# Patient Record
Sex: Female | Born: 1960
Health system: Southern US, Community
[De-identification: ages and names within clinical notes are randomized; demographics above are authoritative.]

## PROBLEM LIST (undated history)

## (undated) DIAGNOSIS — R0789 Other chest pain: Secondary | ICD-10-CM

## (undated) DIAGNOSIS — E785 Hyperlipidemia, unspecified: Secondary | ICD-10-CM

## (undated) DIAGNOSIS — E7800 Pure hypercholesterolemia, unspecified: Secondary | ICD-10-CM

## (undated) DIAGNOSIS — F419 Anxiety disorder, unspecified: Secondary | ICD-10-CM

## (undated) HISTORY — DX: Hyperlipidemia, unspecified: E78.5

## (undated) HISTORY — PX: TUBAL LIGATION: SHX77

## (undated) HISTORY — PX: APPENDECTOMY: SHX54

## (undated) HISTORY — DX: Anxiety disorder, unspecified: F41.9

## (undated) HISTORY — PX: SHOULDER ARTHROSCOPY: SHX128

## (undated) HISTORY — DX: Pure hypercholesterolemia, unspecified: E78.00

## (undated) HISTORY — DX: Other chest pain: R07.89

---

## 2005-05-25 ENCOUNTER — Other Ambulatory Visit: Admission: RE | Admit: 2005-05-25 | Discharge: 2005-05-25 | Payer: Self-pay | Admitting: Family Medicine

## 2006-05-28 ENCOUNTER — Other Ambulatory Visit: Admission: RE | Admit: 2006-05-28 | Discharge: 2006-05-28 | Payer: Self-pay | Admitting: Family Medicine

## 2007-06-02 ENCOUNTER — Other Ambulatory Visit: Admission: RE | Admit: 2007-06-02 | Discharge: 2007-06-02 | Payer: Self-pay | Admitting: Family Medicine

## 2007-08-14 ENCOUNTER — Encounter: Admission: RE | Admit: 2007-08-14 | Discharge: 2007-08-14 | Payer: Self-pay | Admitting: Family Medicine

## 2007-08-15 ENCOUNTER — Other Ambulatory Visit: Admission: RE | Admit: 2007-08-15 | Discharge: 2007-08-15 | Payer: Self-pay | Admitting: Family Medicine

## 2007-08-22 ENCOUNTER — Encounter: Admission: RE | Admit: 2007-08-22 | Discharge: 2007-08-22 | Payer: Self-pay | Admitting: Family Medicine

## 2008-02-16 ENCOUNTER — Encounter: Admission: RE | Admit: 2008-02-16 | Discharge: 2008-02-16 | Payer: Self-pay | Admitting: Family Medicine

## 2008-08-17 ENCOUNTER — Other Ambulatory Visit: Admission: RE | Admit: 2008-08-17 | Discharge: 2008-08-17 | Payer: Self-pay | Admitting: Family Medicine

## 2008-08-20 ENCOUNTER — Encounter: Admission: RE | Admit: 2008-08-20 | Discharge: 2008-08-20 | Payer: Self-pay | Admitting: Family Medicine

## 2009-02-24 ENCOUNTER — Encounter: Admission: RE | Admit: 2009-02-24 | Discharge: 2009-02-24 | Payer: Self-pay | Admitting: Family Medicine

## 2009-08-30 ENCOUNTER — Other Ambulatory Visit: Admission: RE | Admit: 2009-08-30 | Discharge: 2009-08-30 | Payer: Self-pay | Admitting: Family Medicine

## 2009-09-05 ENCOUNTER — Encounter: Admission: RE | Admit: 2009-09-05 | Discharge: 2009-09-05 | Payer: Self-pay | Admitting: Family Medicine

## 2010-08-27 ENCOUNTER — Encounter: Payer: Self-pay | Admitting: Family Medicine

## 2010-09-13 ENCOUNTER — Other Ambulatory Visit: Payer: Self-pay | Admitting: Family Medicine

## 2010-09-13 ENCOUNTER — Other Ambulatory Visit (HOSPITAL_COMMUNITY)
Admission: RE | Admit: 2010-09-13 | Discharge: 2010-09-13 | Disposition: A | Discharge status: Home / Self Care | Payer: Managed Care, Other (non HMO) | Source: Ambulatory Visit | Attending: Family Medicine | Admitting: Family Medicine

## 2010-09-13 DIAGNOSIS — Z124 Encounter for screening for malignant neoplasm of cervix: Secondary | ICD-10-CM | POA: Insufficient documentation

## 2011-07-04 ENCOUNTER — Other Ambulatory Visit: Payer: Self-pay | Admitting: Family Medicine

## 2011-07-04 DIAGNOSIS — Z1231 Encounter for screening mammogram for malignant neoplasm of breast: Secondary | ICD-10-CM

## 2011-07-23 ENCOUNTER — Emergency Department (HOSPITAL_COMMUNITY)
Admission: RE | Admit: 2011-07-23 | Discharge: 2011-07-23 | Disposition: A | Discharge status: Home / Self Care | Payer: Managed Care, Other (non HMO) | Source: Ambulatory Visit | Attending: Emergency Medicine | Admitting: Emergency Medicine

## 2011-07-23 ENCOUNTER — Emergency Department (INDEPENDENT_AMBULATORY_CARE_PROVIDER_SITE_OTHER)
Admission: EM | Admit: 2011-07-23 | Discharge: 2011-07-23 | Disposition: A | Discharge status: Home / Self Care | Payer: Managed Care, Other (non HMO) | Source: Home / Self Care | Attending: Emergency Medicine | Admitting: Emergency Medicine

## 2011-07-23 DIAGNOSIS — IMO0002 Reserved for concepts with insufficient information to code with codable children: Secondary | ICD-10-CM

## 2011-07-23 DIAGNOSIS — S93409A Sprain of unspecified ligament of unspecified ankle, initial encounter: Secondary | ICD-10-CM

## 2011-07-23 DIAGNOSIS — S93699A Other sprain of unspecified foot, initial encounter: Secondary | ICD-10-CM

## 2011-07-23 DIAGNOSIS — Z0389 Encounter for observation for other suspected diseases and conditions ruled out: Secondary | ICD-10-CM | POA: Insufficient documentation

## 2011-07-23 MED ORDER — HYDROCODONE-ACETAMINOPHEN 5-325 MG PO TABS: 2.0000 | ORAL_TABLET | ORAL | Status: AC | PRN

## 2011-07-23 NOTE — ED Notes (Signed)
Pt placed in wheel chair with wheels lock before moving them onto chair. Pt placed in vehicle to be taken to get x-ray done.

## 2011-07-23 NOTE — ED Provider Notes (Signed)
History     CSN: [id number] Arrival date & time: 07/23/2011  8:25 AM   First MD Initiated Contact with Patient 07/23/11 [phone]      No chief complaint on file.   (Consider location/radiation/quality/duration/timing/severity/associated sxs/prior treatment) HPI Comments: Slid at home yesterday, twisted my foot heard "clicking sounds" its swollen and hurts to walk on it"  Patient is a 50 y.o. female presenting with ankle pain. The history is provided by the patient.  Ankle Pain  The incident occurred yesterday. The incident occurred at home. The injury mechanism was a fall and torsion. The pain is present in the right foot and right ankle. The pain is at a severity of 6/10. The pain is moderate. The pain has been constant since onset. Associated symptoms include inability to bear weight. Pertinent negatives include no numbness, no muscle weakness, no loss of sensation and no tingling. She reports no foreign bodies present. The symptoms are aggravated by bearing weight and activity. The treatment provided no relief.    No past medical history on file.  No past surgical history on file.  No family history on file.  History  Substance Use Topics  . Smoking status: Not on file  . Smokeless tobacco: Not on file  . Alcohol Use: Not on file    OB History    No data available      Review of Systems  Musculoskeletal: Negative for joint swelling.  Neurological: Negative for tingling, weakness and numbness.    Allergies  Review of patient's allergies indicates not on file.  Home Medications  No current outpatient prescriptions on file.  There were no vitals taken for this visit.  Physical Exam  Musculoskeletal:       Right ankle: She exhibits decreased range of motion, swelling, ecchymosis and deformity. tenderness. Medial malleolus tenderness found. No AITFL, no CF ligament, no posterior TFL and no proximal fibula tenderness found. Achilles tendon normal.       Feet:  Skin:  Skin is warm and intact. No abrasion, no bruising and no ecchymosis noted. No erythema.    ED Course  Procedures (including critical care time)  Labs Reviewed - No data to display No results found.   No diagnosis found.    MDM          Jimmie Molly, MD 07/23/11 [phone]

## 2011-07-23 NOTE — ED Notes (Signed)
This am fell off porch step and noted right ankle to be swollen and painful.  Noted small amount of swelling, good capillary refill and pulses. Able to put small amount of weight on foot.

## 2011-07-23 NOTE — ED Notes (Signed)
Pt. Returned from Radiology at 1015.

## 2011-07-23 NOTE — ED Notes (Signed)
Pt. Taken to [facility] via [facility] to obtain x-rays.

## 2011-07-24 ENCOUNTER — Telehealth (HOSPITAL_COMMUNITY): Payer: Self-pay | Admitting: *Deleted

## 2011-07-24 NOTE — ED Notes (Signed)
Discussed with Dr. Lorenz Coaster and he said no more Oxycodone unless she comes and is seen again.  She can try Acetaminophen 650 mg. Every 4 hrs or Ibuprofen 800 mg. TID with food. Vassie Moselle 07/24/2011

## 2011-08-16 ENCOUNTER — Ambulatory Visit
Admission: RE | Admit: 2011-08-16 | Discharge: 2011-08-16 | Disposition: A | Discharge status: Home / Self Care | Payer: Managed Care, Other (non HMO) | Source: Ambulatory Visit | Attending: Family Medicine | Admitting: Family Medicine

## 2011-08-16 DIAGNOSIS — Z1231 Encounter for screening mammogram for malignant neoplasm of breast: Secondary | ICD-10-CM

## 2012-09-01 ENCOUNTER — Other Ambulatory Visit: Payer: Self-pay | Admitting: Family Medicine

## 2012-09-01 DIAGNOSIS — Z1231 Encounter for screening mammogram for malignant neoplasm of breast: Secondary | ICD-10-CM

## 2012-09-30 ENCOUNTER — Ambulatory Visit
Admission: RE | Admit: 2012-09-30 | Discharge: 2012-09-30 | Disposition: A | Discharge status: Home / Self Care | Payer: Managed Care, Other (non HMO) | Source: Ambulatory Visit | Attending: Family Medicine | Admitting: Family Medicine

## 2012-09-30 DIAGNOSIS — Z1231 Encounter for screening mammogram for malignant neoplasm of breast: Secondary | ICD-10-CM

## 2013-08-27 ENCOUNTER — Other Ambulatory Visit: Payer: Self-pay

## 2013-08-27 DIAGNOSIS — Z1231 Encounter for screening mammogram for malignant neoplasm of breast: Secondary | ICD-10-CM

## 2013-10-01 ENCOUNTER — Ambulatory Visit: Payer: Managed Care, Other (non HMO)

## 2013-10-13 ENCOUNTER — Ambulatory Visit
Admission: RE | Admit: 2013-10-13 | Discharge: 2013-10-13 | Disposition: A | Discharge status: Home / Self Care | Payer: Private Health Insurance - Indemnity | Source: Ambulatory Visit

## 2013-10-13 DIAGNOSIS — Z1231 Encounter for screening mammogram for malignant neoplasm of breast: Secondary | ICD-10-CM

## 2014-09-02 ENCOUNTER — Other Ambulatory Visit: Payer: Self-pay

## 2014-09-02 DIAGNOSIS — Z1231 Encounter for screening mammogram for malignant neoplasm of breast: Secondary | ICD-10-CM

## 2014-10-15 ENCOUNTER — Encounter (INDEPENDENT_AMBULATORY_CARE_PROVIDER_SITE_OTHER): Payer: Self-pay

## 2014-10-15 ENCOUNTER — Ambulatory Visit
Admission: RE | Admit: 2014-10-15 | Discharge: 2014-10-15 | Disposition: A | Discharge status: Home / Self Care | Payer: BLUE CROSS/BLUE SHIELD | Source: Ambulatory Visit

## 2014-10-15 DIAGNOSIS — Z1231 Encounter for screening mammogram for malignant neoplasm of breast: Secondary | ICD-10-CM

## 2014-10-19 ENCOUNTER — Other Ambulatory Visit (HOSPITAL_COMMUNITY)
Admission: RE | Admit: 2014-10-19 | Discharge: 2014-10-19 | Disposition: A | Discharge status: Home / Self Care | Payer: BLUE CROSS/BLUE SHIELD | Source: Ambulatory Visit | Attending: Family Medicine | Admitting: Family Medicine

## 2014-10-19 ENCOUNTER — Other Ambulatory Visit: Payer: Self-pay | Admitting: Family Medicine

## 2014-10-19 DIAGNOSIS — Z124 Encounter for screening for malignant neoplasm of cervix: Secondary | ICD-10-CM | POA: Insufficient documentation

## 2014-10-21 LAB — CYTOLOGY - PAP

## 2014-11-03 ENCOUNTER — Other Ambulatory Visit: Payer: Self-pay | Admitting: Orthopedic Surgery

## 2014-11-03 DIAGNOSIS — M25512 Pain in left shoulder: Secondary | ICD-10-CM

## 2014-11-10 ENCOUNTER — Other Ambulatory Visit: Payer: Self-pay | Admitting: Gastroenterology

## 2014-11-22 ENCOUNTER — Ambulatory Visit
Admission: RE | Admit: 2014-11-22 | Discharge: 2014-11-22 | Disposition: A | Discharge status: Home / Self Care | Payer: BLUE CROSS/BLUE SHIELD | Source: Ambulatory Visit | Attending: Orthopedic Surgery | Admitting: Orthopedic Surgery

## 2014-11-22 DIAGNOSIS — M25512 Pain in left shoulder: Secondary | ICD-10-CM

## 2015-09-08 ENCOUNTER — Other Ambulatory Visit: Payer: Self-pay

## 2015-09-08 DIAGNOSIS — Z1231 Encounter for screening mammogram for malignant neoplasm of breast: Secondary | ICD-10-CM

## 2015-10-17 ENCOUNTER — Ambulatory Visit
Admission: RE | Admit: 2015-10-17 | Discharge: 2015-10-17 | Disposition: A | Discharge status: Home / Self Care | Payer: BLUE CROSS/BLUE SHIELD | Source: Ambulatory Visit

## 2015-10-17 DIAGNOSIS — Z1231 Encounter for screening mammogram for malignant neoplasm of breast: Secondary | ICD-10-CM

## 2015-10-19 ENCOUNTER — Other Ambulatory Visit: Payer: Self-pay | Admitting: Family Medicine

## 2015-10-19 DIAGNOSIS — R928 Other abnormal and inconclusive findings on diagnostic imaging of breast: Secondary | ICD-10-CM

## 2015-10-26 ENCOUNTER — Ambulatory Visit
Admission: RE | Admit: 2015-10-26 | Discharge: 2015-10-26 | Disposition: A | Discharge status: Home / Self Care | Payer: BLUE CROSS/BLUE SHIELD | Source: Ambulatory Visit | Attending: Family Medicine | Admitting: Family Medicine

## 2015-10-26 DIAGNOSIS — R928 Other abnormal and inconclusive findings on diagnostic imaging of breast: Secondary | ICD-10-CM

## 2015-11-09 DIAGNOSIS — E782 Mixed hyperlipidemia: Secondary | ICD-10-CM | POA: Diagnosis not present

## 2015-11-09 DIAGNOSIS — Z0000 Encounter for general adult medical examination without abnormal findings: Secondary | ICD-10-CM | POA: Diagnosis not present

## 2015-11-09 DIAGNOSIS — R0789 Other chest pain: Secondary | ICD-10-CM | POA: Diagnosis not present

## 2016-04-05 ENCOUNTER — Other Ambulatory Visit: Payer: Self-pay | Admitting: Physician Assistant

## 2016-04-05 DIAGNOSIS — R0789 Other chest pain: Secondary | ICD-10-CM

## 2016-04-12 ENCOUNTER — Encounter: Payer: Self-pay | Admitting: Physician Assistant

## 2016-04-17 ENCOUNTER — Ambulatory Visit (INDEPENDENT_AMBULATORY_CARE_PROVIDER_SITE_OTHER): Payer: BLUE CROSS/BLUE SHIELD

## 2016-04-17 DIAGNOSIS — R0789 Other chest pain: Secondary | ICD-10-CM

## 2016-04-17 LAB — EXERCISE TOLERANCE TEST
CHL CUP RESTING HR STRESS: 150 {beats}/min
CHL RATE OF PERCEIVED EXERTION: 17
CSEPEDS: 0 s
Estimated workload: 8.5 METS
Exercise duration (min): 7 min
MPHR: 168 {beats}/min
Peak HR: 150 {beats}/min
Percent HR: 89 %

## 2016-04-19 ENCOUNTER — Telehealth: Payer: Self-pay | Admitting: Cardiovascular Disease

## 2016-04-19 NOTE — Telephone Encounter (Signed)
Received records from [facility] for appointment on 05/11/16 with Dr [place].  Records given to N Hines (medical records) for Dr Haleiwa's schedule on 05/11/16. lp °

## 2016-05-11 ENCOUNTER — Ambulatory Visit: Payer: BLUE CROSS/BLUE SHIELD | Admitting: Cardiovascular Disease

## 2016-05-18 ENCOUNTER — Ambulatory Visit: Payer: BLUE CROSS/BLUE SHIELD | Admitting: Cardiovascular Disease

## 2016-05-30 NOTE — Progress Notes (Signed)
Cardiology Office Note   Date:  05/31/2016   ID:  Lindsay Long, DOB 05/07/1961, MRN [id number]  PCP:  Lupe Carney, MD  Cardiologist:   Chilton Si, MD   Chief Complaint  Patient presents with  . New Patient (Initial Visit)    chest pain/pressure.and tightness earlier this year.       History of Present Illness: Lindsay Long is a 55 y.o. female with hyperlipidemia and anxiety who presents for evaluation of an abnormal stress test.Lindsay Long saw Lindsay Long, [place] on 04/05/16. At that time she reported chest tightness.  She was referred for an exercise stress test that revealed 1 mm ST depression in the lateral leads during recovery. It was somewhat upsloping and either a cardiac CTA or perfusion study was recommended.  Lindsay Long reports to types of chest pain. Several months ago she started noticing chest tightness while at rest.  It only occurs when she is sleeping.  In May her step father had an accident and she has been taking care of both her mother and step father.  She also notes chest aching and tightness that occurs during stressful situations.  It lasts until she takes a Xanax.  There is no associated shortness of breath, nausea or diaphoresis. She had similar chest discomfort in 2006 at which time she had a stress test that was reportedly negative. She was prescribed Xanax as it was attributed to stress.   Lindsay Long walks a lot at work but doesn't get much formal exercise.   she denies any exertional symptoms. She does sometimes note mild lower extremity edema after she has been on her feet for long periods of time, but it improves with elevation of her legs. There is no associated orthopnea or PND. She sometimes also notes palpitations that last for a few seconds and are not associated with chest pain, shortness of breath, lightheadedness or dizziness.    Past Medical History:  Diagnosis Date  . Anxiety   . Anxiety 05/31/2016  . Atypical chest pain 05/31/2016  .  High cholesterol   . Hyperlipidemia 05/31/2016    Past Surgical History:  Procedure Laterality Date  . APPENDECTOMY    . TUBAL LIGATION       Current Outpatient Prescriptions  Medication Sig Dispense Refill  . ALPRAZolam (XANAX) 0.25 MG tablet Take 0.25 mg by mouth 3 (three) times daily as needed for anxiety.    . diphenhydramine-acetaminophen (TYLENOL PM EXTRA STRENGTH) 25-500 MG TABS tablet Take 1 tablet by mouth at bedtime as needed.     No current facility-administered medications for this visit.     Allergies:   Penicillins    Social History:  The patient  reports that she quit smoking about 8 years ago. She has never used smokeless tobacco. She reports that she drinks alcohol. She reports that she does not use drugs.   Family History:  The patient's family history includes Alcohol abuse in her sister and sister; CAD in her maternal grandmother and sister; COPD in her mother and sister; CVA in her sister; Depression in her mother; Diabetes in her brother, other, and sister; Diabetes Mellitus I in her father, maternal grandfather, maternal grandmother, mother, and paternal grandmother; Drug abuse in her sister; Heart failure in her mother; Hypertension in her other; Liver disease in her mother; Lung cancer in her paternal grandfather and paternal uncle; Lupus in her sister.    ROS:  Please see the history of present illness.   Otherwise, review of  systems are positive for none.   All other systems are reviewed and negative.    PHYSICAL EXAM: VS:  BP 117/71   Pulse (!) 53   Ht 5' 4.5" (1.638 m)   Wt 79.9 kg (176 lb 3.2 oz)   BMI 29.78 kg/m  , BMI Body mass index is 29.78 kg/m. GENERAL:  Well appearing HEENT:  Pupils equal round and reactive, fundi not visualized, oral mucosa unremarkable NECK:  No jugular venous distention, waveform within normal limits, carotid upstroke brisk and symmetric, no bruits, no thyromegaly LYMPHATICS:  No cervical adenopathy LUNGS:  Clear to  auscultation bilaterally HEART:  RRR.  PMI not displaced or sustained,S1 and S2 within normal limits, no S3, no S4, no clicks, no rubs, no murmurs ABD:  Flat, positive bowel sounds normal in frequency in pitch, no bruits, no rebound, no guarding, no midline pulsatile mass, no hepatomegaly, no splenomegaly EXT:  2 plus pulses throughout, no edema, no cyanosis no clubbing SKIN:  No rashes no nodules NEURO:  Cranial nerves II through XII grossly intact, motor grossly intact throughout PSYCH:  Cognitively intact, oriented to person place and time    EKG:  EKG is ordered today. The ekg ordered today demonstrates sinus bradycardia. Rate 53 bpm.  ETT 04/17/16:  Blood pressure demonstrated a normal response to exercise.  ST segment depression of 1 mm was noted during stress in the V4, V5 and V6 leads.   1 mm horizontal ST depression in recovery lateral leads Suggest f/u cardiac CTA or perfusion study   Recent Labs: No results found for requested labs within last 8760 hours.   11/09/15: Sodium 138, potassium 4.5, BUN 14, creatinine 0.7 to AST 15 ALT 22 Total cholesterol 218, triglycerides 110, HDL 53, LDL 143  Lipid Panel No results found for: CHOL, TRIG, HDL, CHOLHDL, VLDL, LDLCALC, LDLDIRECT    Wt Readings from Last 3 Encounters:  05/31/16 79.9 kg (176 lb 3.2 oz)      ASSESSMENT AND PLAN:  # Abnormal stress test: # Atypical Chest pain: Lindsay Long Had a stress test that was mildly abnormal. It appears that most of her ST depression is upsloping, which is nondiagnostic for ischemia. Her chest pain is very atypical and is relieved with Xanax. Therefore, her pretest probability of coronary disease is still fairly low. We will obtain a cardiac CT angiogram to rule out obstructive coronary disease. We discussed the alternatives of nuclear stress testing and cardiac catheterization. She understands the risks and benefits of each and chooses the cardiac CTA.  # Hyperlipidemia:  Lipids are  elevated. However her ASCVD ten-year risk is 1.4%. Therefore we will not start a statin at this time. I have emphasized the importance of limiting fried and fatty foods as well as increasing her exercise to at least 30-40 minutes most days of the week.    Current medicines are reviewed at length with the patient today.  The patient does not have concerns regarding medicines.  The following changes have been made:  no change  Labs/ tests ordered today include:   Orders Placed This Encounter  Procedures  . CT Heart Morp W/Cta Cor W/Score W/Ca W/Cm &/Or Wo/Cm  . Basic metabolic panel  . EKG 12-Lead     Disposition:   FU with Roan Sawchuk C. Duke Salvia, MD, [facility] as needed    This note was written with the assistance of speech recognition software.  Please excuse any transcriptional errors.  Signed, Augustino Savastano C. Duke Salvia, MD, [facility]  05/31/2016 9:30 AM  [facility]

## 2016-05-31 ENCOUNTER — Ambulatory Visit (INDEPENDENT_AMBULATORY_CARE_PROVIDER_SITE_OTHER): Payer: BLUE CROSS/BLUE SHIELD | Admitting: Cardiovascular Disease

## 2016-05-31 ENCOUNTER — Encounter: Payer: Self-pay | Admitting: Cardiovascular Disease

## 2016-05-31 VITALS — BP 117/71 | HR 53 | Ht 64.5 in | Wt 176.2 lb

## 2016-05-31 DIAGNOSIS — R9439 Abnormal result of other cardiovascular function study: Secondary | ICD-10-CM | POA: Diagnosis not present

## 2016-05-31 DIAGNOSIS — F419 Anxiety disorder, unspecified: Secondary | ICD-10-CM | POA: Insufficient documentation

## 2016-05-31 DIAGNOSIS — E785 Hyperlipidemia, unspecified: Secondary | ICD-10-CM

## 2016-05-31 DIAGNOSIS — R079 Chest pain, unspecified: Secondary | ICD-10-CM | POA: Diagnosis not present

## 2016-05-31 DIAGNOSIS — E7800 Pure hypercholesterolemia, unspecified: Secondary | ICD-10-CM

## 2016-05-31 DIAGNOSIS — R0789 Other chest pain: Secondary | ICD-10-CM

## 2016-05-31 HISTORY — DX: Hyperlipidemia, unspecified: E78.5

## 2016-05-31 HISTORY — DX: Other chest pain: R07.89

## 2016-05-31 HISTORY — DX: Anxiety disorder, unspecified: F41.9

## 2016-05-31 LAB — BASIC METABOLIC PANEL
BUN: 16 mg/dL (ref 7–25)
CHLORIDE: 105 mmol/L (ref 98–110)
CO2: 24 mmol/L (ref 20–31)
CREATININE: 0.91 mg/dL (ref 0.50–1.05)
Calcium: 9.2 mg/dL (ref 8.6–10.4)
GLUCOSE: 95 mg/dL (ref 65–99)
POTASSIUM: 4.1 mmol/L (ref 3.5–5.3)
Sodium: 138 mmol/L (ref 135–146)

## 2016-05-31 NOTE — Patient Instructions (Addendum)
Medication Instructions:  Your physician recommends that you continue on your current medications as directed. Please refer to the Current Medication list given to you today.  Labwork: BMET AT [facility] ON THE FIRST FLOOR   Testing/Procedures: Your physician has requested that you have cardiac CT. Cardiac computed tomography (CT) is a painless test that uses an x-ray machine to take clear, detailed pictures of your heart. For further information please visit [email or web address]. Please follow instruction sheet as given.  Follow-Up: AS NEEDED

## 2016-06-08 ENCOUNTER — Encounter: Payer: Self-pay | Admitting: Cardiovascular Disease

## 2016-06-21 ENCOUNTER — Ambulatory Visit (HOSPITAL_COMMUNITY): Admission: RE | Admit: 2016-06-21 | Payer: BLUE CROSS/BLUE SHIELD | Source: Ambulatory Visit

## 2016-06-22 ENCOUNTER — Telehealth: Payer: Self-pay | Admitting: Cardiovascular Disease

## 2016-06-22 NOTE — Telephone Encounter (Signed)
New message   Pt is returning call to "sharon"  Please call back

## 2016-06-25 ENCOUNTER — Encounter: Payer: Self-pay | Admitting: Cardiovascular Disease

## 2016-07-10 ENCOUNTER — Ambulatory Visit (HOSPITAL_COMMUNITY)
Admission: RE | Admit: 2016-07-10 | Discharge: 2016-07-10 | Disposition: A | Discharge status: Home / Self Care | Payer: BLUE CROSS/BLUE SHIELD | Source: Ambulatory Visit | Attending: Cardiovascular Disease | Admitting: Cardiovascular Disease

## 2016-07-10 ENCOUNTER — Encounter (HOSPITAL_COMMUNITY): Payer: Self-pay

## 2016-07-10 DIAGNOSIS — R079 Chest pain, unspecified: Secondary | ICD-10-CM | POA: Diagnosis not present

## 2016-07-10 DIAGNOSIS — R9439 Abnormal result of other cardiovascular function study: Secondary | ICD-10-CM | POA: Diagnosis not present

## 2016-07-10 MED ORDER — NITROGLYCERIN 0.4 MG SL SUBL
0.8000 mg | SUBLINGUAL_TABLET | Freq: Once | SUBLINGUAL | Status: AC
  Administered 2016-07-10: 0.8 mg via SUBLINGUAL

## 2016-07-10 MED ORDER — NITROGLYCERIN 0.4 MG SL SUBL
SUBLINGUAL_TABLET | SUBLINGUAL | Status: AC
  Filled 2016-07-10: qty 2

## 2016-07-10 MED ORDER — IOPAMIDOL (ISOVUE-370) INJECTION 76%
INTRAVENOUS | Status: AC
  Administered 2016-07-10: 80 mL
  Filled 2016-07-10: qty 100

## 2016-07-10 NOTE — Progress Notes (Signed)
CT scan completed. Tolerated well. D/C home walking with husband. Awake and alert. In no distress. 

## 2016-11-29 DIAGNOSIS — E782 Mixed hyperlipidemia: Secondary | ICD-10-CM | POA: Diagnosis not present

## 2016-11-29 DIAGNOSIS — Z0000 Encounter for general adult medical examination without abnormal findings: Secondary | ICD-10-CM | POA: Diagnosis not present

## 2016-12-03 ENCOUNTER — Other Ambulatory Visit: Payer: Self-pay | Admitting: Family Medicine

## 2016-12-03 DIAGNOSIS — Z1231 Encounter for screening mammogram for malignant neoplasm of breast: Secondary | ICD-10-CM

## 2016-12-19 ENCOUNTER — Ambulatory Visit
Admission: RE | Admit: 2016-12-19 | Discharge: 2016-12-19 | Disposition: A | Discharge status: Home / Self Care | Payer: BLUE CROSS/BLUE SHIELD | Source: Ambulatory Visit | Attending: Family Medicine | Admitting: Family Medicine

## 2016-12-19 DIAGNOSIS — Z1231 Encounter for screening mammogram for malignant neoplasm of breast: Secondary | ICD-10-CM

## 2017-01-14 DIAGNOSIS — J329 Chronic sinusitis, unspecified: Secondary | ICD-10-CM | POA: Diagnosis not present

## 2017-03-25 ENCOUNTER — Emergency Department (HOSPITAL_COMMUNITY): Payer: BLUE CROSS/BLUE SHIELD

## 2017-03-25 ENCOUNTER — Encounter (HOSPITAL_COMMUNITY): Payer: Self-pay | Admitting: Emergency Medicine

## 2017-03-25 ENCOUNTER — Emergency Department (HOSPITAL_COMMUNITY)
Admission: EM | Admit: 2017-03-25 | Discharge: 2017-03-25 | Disposition: A | Discharge status: Home / Self Care | Payer: BLUE CROSS/BLUE SHIELD | Attending: Emergency Medicine | Admitting: Emergency Medicine

## 2017-03-25 DIAGNOSIS — E785 Hyperlipidemia, unspecified: Secondary | ICD-10-CM | POA: Diagnosis not present

## 2017-03-25 DIAGNOSIS — R1013 Epigastric pain: Secondary | ICD-10-CM | POA: Diagnosis not present

## 2017-03-25 DIAGNOSIS — K7689 Other specified diseases of liver: Secondary | ICD-10-CM | POA: Diagnosis not present

## 2017-03-25 DIAGNOSIS — R109 Unspecified abdominal pain: Secondary | ICD-10-CM | POA: Diagnosis not present

## 2017-03-25 DIAGNOSIS — Z87891 Personal history of nicotine dependence: Secondary | ICD-10-CM | POA: Diagnosis not present

## 2017-03-25 DIAGNOSIS — E7800 Pure hypercholesterolemia, unspecified: Secondary | ICD-10-CM | POA: Diagnosis not present

## 2017-03-25 DIAGNOSIS — R1010 Upper abdominal pain, unspecified: Secondary | ICD-10-CM | POA: Diagnosis not present

## 2017-03-25 LAB — CBC
HCT: 38.5 % (ref 36.0–46.0)
Hemoglobin: 12.7 g/dL (ref 12.0–15.0)
MCH: 30.2 pg (ref 26.0–34.0)
MCHC: 33 g/dL (ref 30.0–36.0)
MCV: 91.7 fL (ref 78.0–100.0)
Platelets: 275 10*3/uL (ref 150–400)
RBC: 4.2 MIL/uL (ref 3.87–5.11)
RDW: 12.8 % (ref 11.5–15.5)
WBC: 6 10*3/uL (ref 4.0–10.5)

## 2017-03-25 LAB — COMPREHENSIVE METABOLIC PANEL
ALK PHOS: 84 U/L (ref 38–126)
ALT: 54 U/L (ref 14–54)
AST: 82 U/L — AB (ref 15–41)
Albumin: 3.8 g/dL (ref 3.5–5.0)
Anion gap: 13 (ref 5–15)
BUN: 14 mg/dL (ref 6–20)
CALCIUM: 9.6 mg/dL (ref 8.9–10.3)
CHLORIDE: 106 mmol/L (ref 101–111)
CO2: 19 mmol/L — AB (ref 22–32)
Creatinine, Ser: 0.8 mg/dL (ref 0.44–1.00)
Glucose, Bld: 94 mg/dL (ref 65–99)
Potassium: 3.6 mmol/L (ref 3.5–5.1)
Sodium: 138 mmol/L (ref 135–145)
Total Bilirubin: 0.8 mg/dL (ref 0.3–1.2)
Total Protein: 7.2 g/dL (ref 6.5–8.1)

## 2017-03-25 LAB — URINALYSIS, ROUTINE W REFLEX MICROSCOPIC
Bilirubin Urine: NEGATIVE
Glucose, UA: NEGATIVE mg/dL
Hgb urine dipstick: NEGATIVE
Ketones, ur: NEGATIVE mg/dL
Leukocytes, UA: NEGATIVE
Nitrite: NEGATIVE
PROTEIN: NEGATIVE mg/dL
Specific Gravity, Urine: 1.009 (ref 1.005–1.030)
pH: 5 (ref 5.0–8.0)

## 2017-03-25 LAB — I-STAT TROPONIN, ED: TROPONIN I, POC: 0 ng/mL (ref 0.00–0.08)

## 2017-03-25 LAB — LIPASE, BLOOD: LIPASE: 33 U/L (ref 11–51)

## 2017-03-25 MED ORDER — SUCRALFATE 1 GM/10ML PO SUSP: 1.0000 g | Freq: Three times a day (TID) | ORAL | 0 refills | Status: DC

## 2017-03-25 MED ORDER — SUCRALFATE 1 G PO TABS
1.0000 g | ORAL_TABLET | Freq: Once | ORAL | Status: AC
  Administered 2017-03-25: 1 g via ORAL
  Filled 2017-03-25: qty 1

## 2017-03-25 MED ORDER — GI COCKTAIL ~~LOC~~
30.0000 mL | Freq: Once | ORAL | Status: AC
  Administered 2017-03-25: 30 mL via ORAL
  Filled 2017-03-25: qty 30

## 2017-03-25 MED ORDER — IOPAMIDOL (ISOVUE-300) INJECTION 61%
INTRAVENOUS | Status: AC
  Administered 2017-03-25: 100 mL
  Filled 2017-03-25: qty 100

## 2017-03-25 MED ORDER — OMEPRAZOLE 20 MG PO CPDR: 20.0000 mg | DELAYED_RELEASE_CAPSULE | Freq: Every day | ORAL | 0 refills | Status: DC

## 2017-03-25 MED ORDER — SODIUM CHLORIDE 0.9 % IV SOLN
INTRAVENOUS | Status: DC
  Administered 2017-03-25: 08:00:00 via INTRAVENOUS

## 2017-03-25 MED ORDER — FAMOTIDINE IN NACL 20-0.9 MG/50ML-% IV SOLN
20.0000 mg | Freq: Once | INTRAVENOUS | Status: AC
  Administered 2017-03-25: 20 mg via INTRAVENOUS
  Filled 2017-03-25: qty 50

## 2017-03-25 NOTE — ED Triage Notes (Signed)
Pt reports epigastric pain onset 0230 with radiation into back. Rates pain 9/10, sharp/stabbing in nature. Denies N/V/D. Pt states she has had acid reflux for past few days, pt also noted to have hiccups in triage.

## 2017-03-25 NOTE — ED Notes (Signed)
Patient going to CT

## 2017-03-25 NOTE — ED Notes (Signed)
Patient at CT

## 2017-03-25 NOTE — ED Provider Notes (Signed)
[facility]EMERGENCY DEPT Provider Note   CSN: [id number] Arrival date & time: 03/25/17  4098     History   Chief Complaint Chief Complaint  Patient presents with  . Abdominal Pain  . Back Pain    HPI Lindsay Long is a 56 y.o. female.  HPI  Patient presents with abdominal pain that woke her up. Pain began about 5 hours prior to my evaluation, the patient suddenly felt sharp pain in the mid upper abdomen with radiation to the back, and right infrascapular region. There is mild associated nausea, great generalized discomfort. No clear alleviating or exacerbating factors. Patient denies substantial medical problems, does have history of prior appendectomy. Patient takes no medication regularly for GI illness, does take Tums occasionally, and took this today, had no change in her condition. She denies weakness in her extremities, numbness in her extremities.    Past Medical History:  Diagnosis Date  . Anxiety   . Anxiety 05/31/2016  . Atypical chest pain 05/31/2016  . High cholesterol   . Hyperlipidemia 05/31/2016    Patient Active Problem List   Diagnosis Date Noted  . Hyperlipidemia 05/31/2016  . Anxiety 05/31/2016  . Atypical chest pain 05/31/2016    Past Surgical History:  Procedure Laterality Date  . APPENDECTOMY    . TUBAL LIGATION      OB History    No data available       Home Medications    Prior to Admission medications   Medication Sig Start Date End Date Taking? Authorizing Provider  ALPRAZolam (XANAX) 0.25 MG tablet Take 0.25 mg by mouth 3 (three) times daily as needed for anxiety.    [provider]  diphenhydramine-acetaminophen (TYLENOL PM EXTRA STRENGTH) 25-500 MG TABS tablet Take 1 tablet by mouth at bedtime as needed.    [provider]    Family History Family History  Problem Relation Age of Onset  . Diabetes Other   . Hypertension Other   . Depression Mother   . Diabetes Mellitus I Mother   . Heart failure Mother    . COPD Mother   . Liver disease Mother   . Diabetes Mellitus I Father   . Lupus Sister   . CVA Sister   . Alcohol abuse Sister   . Diabetes Brother   . Diabetes Mellitus I Maternal Grandmother   . CAD Maternal Grandmother   . Diabetes Mellitus I Maternal Grandfather   . Diabetes Mellitus I Paternal Grandmother   . Lung cancer Paternal Grandfather   . Drug abuse Sister   . Alcohol abuse Sister   . CAD Sister   . COPD Sister   . Diabetes Sister   . Lung cancer Paternal Uncle   . Breast cancer Maternal Aunt   . Colon cancer Neg Hx   . Colon polyps Neg Hx     Social History Social History  Substance Use Topics  . Smoking status: Former Smoker    Quit date: 04/12/2008  . Smokeless tobacco: Never Used  . Alcohol use Yes     Comment: occational     Allergies   Penicillins   Review of Systems Review of Systems  Constitutional:       Per HPI, otherwise negative  HENT:       Per HPI, otherwise negative  Respiratory:       Per HPI, otherwise negative  Cardiovascular:       Per HPI, otherwise negative  Gastrointestinal: Positive for abdominal pain. Negative for vomiting.  Endocrine:       Negative aside from HPI  Genitourinary:       Neg aside from HPI   Musculoskeletal:       Per HPI, otherwise negative  Skin: Negative.   Neurological: Negative for syncope.     Physical Exam Updated Vital Signs BP 129/72   Pulse (!) 56   Temp 98 F (36.7 C) (Oral)   Resp 19   Ht 5\' 4"  (1.626 m)   Wt 81.6 kg (180 lb)   LMP 06/26/2011   SpO2 97%   BMI 30.90 kg/m   Physical Exam  Constitutional: She is oriented to person, place, and time. She appears well-developed and well-nourished. No distress.  HENT:  Head: Normocephalic and atraumatic.  Eyes: Conjunctivae and EOM are normal.  Cardiovascular: Normal rate and regular rhythm.   Pulses symmetric and normal in both upper extremities  Pulmonary/Chest: Effort normal and breath sounds normal. No stridor. No respiratory  distress.  Abdominal: She exhibits no distension. There is tenderness in the right upper quadrant and epigastric area. There is guarding.  Musculoskeletal: She exhibits no edema.  Neurological: She is alert and oriented to person, place, and time. No cranial nerve deficit.  Skin: Skin is warm and dry.  Psychiatric: She has a normal mood and affect.  Nursing note and vitals reviewed.    ED Treatments / Results  Labs (all labs ordered are listed, but only abnormal results are displayed) Labs Reviewed  COMPREHENSIVE METABOLIC PANEL - Abnormal; Notable for the following:       Result Value   CO2 19 (*)    AST 82 (*)    All other components within normal limits  URINALYSIS, ROUTINE W REFLEX MICROSCOPIC - Abnormal; Notable for the following:    Color, Urine STRAW (*)    All other components within normal limits  LIPASE, BLOOD  CBC  I-STAT TROPONIN, ED    EKG  EKG Interpretation  Date/Time:  Monday March 25 2017 06:41:13 EDT Ventricular Rate:  55 PR Interval:    QRS Duration: 95 QT Interval:  434 QTC Calculation: 416 R Axis:   22 Text Interpretation:  Sinus rhythm No previous ECGs available Confirmed by Glynn Octave [phone]) on 03/25/2017 6:59:47 AM       Radiology US Abdomen Limited  Result Date: 03/25/2017 CLINICAL DATA:  Right upper quadrant abdominal pain today. EXAM: ULTRASOUND ABDOMEN LIMITED RIGHT UPPER QUADRANT COMPARISON:  None. FINDINGS: Gallbladder: No gallstones or wall thickening visualized. No sonographic Murphy sign noted by sonographer. Common bile duct: Diameter: 8 mm.  No evidence of choledocholithiasis. Liver: The hepatic echogenicity is mildly heterogeneous. There is a cyst in the left hepatic lobe measuring up to 13 mm in diameter. No suspicious hepatic findings. Portal vein is patent on color Doppler imaging with normal direction of blood flow towards the liver. IMPRESSION: 1. The common bile duct caliber is at the upper limits of normal. No evidence of  choledocholithiasis, intrahepatic biliary dilatation or cholecystitis. 2. Small hepatic cyst. Electronically Signed   By: Carey Bullocks M.D.   On: 03/25/2017 08:17    Procedures Procedures (including critical care time)  Medications Ordered in ED Medications  0.9 %  sodium chloride infusion ( Intravenous New Bag/Given 03/25/17 0740)  famotidine (PEPCID) IVPB 20 mg premix (0 mg Intravenous Stopped 03/25/17 0831)  gi cocktail (Maalox,Lidocaine,Donnatal) (30 mLs Oral Given 03/25/17 0741)     Initial Impression / Assessment and Plan / ED Course  I have reviewed the triage  vital signs and the nursing notes.  Pertinent labs & imaging results that were available during my care of the patient were reviewed by me and considered in my medical decision making (see chart for details).     9:18 AM On repeat evaluation after initial results are available, patient is better, she continues to complain of pain in the upper abdomen. I reviewed the initial findings with the patient. Given the patient's absence of prior evaluation, and persistent pain in spite of initial medication, she will have CT scan to exclude other gastric pathology, mass.  11:05 AM I discussed the CT results with patient and her husband. We discussed the reassuring findings, absence of evidence for perforation, mass. We again discussed her recent history, and she affirms that she has had a typical day for her over the past week, while she has been on medication. This, and the findings, and her description of symptoms all suggest gastric etiology for her pain. With otherwise reassuring findings, no evidence for acute lab abnormality, no evidence for peritonitis, the patient was started on PPI therapy, follow-up with gastroenterology.  Final Clinical Impressions(s) / ED Diagnoses  Abdominal pain   Gerhard Munch, MD 03/25/17 1106

## 2017-03-25 NOTE — ED Notes (Signed)
Patient at u/s 

## 2017-03-25 NOTE — Discharge Instructions (Signed)
As discussed, your symptoms are likely due to irritation of the stomach lining. It is important to monitor your condition carefully, take medication as directed and follow-up with our gastroenterology colleagues as needed. Return here for any concerning changes in your condition.

## 2017-04-02 ENCOUNTER — Encounter: Payer: Self-pay | Admitting: Gastroenterology

## 2017-04-17 ENCOUNTER — Encounter: Payer: Self-pay | Admitting: Gastroenterology

## 2017-04-17 ENCOUNTER — Ambulatory Visit (INDEPENDENT_AMBULATORY_CARE_PROVIDER_SITE_OTHER): Payer: BLUE CROSS/BLUE SHIELD | Admitting: Gastroenterology

## 2017-04-17 VITALS — BP 100/70 | HR 64 | Ht 64.0 in | Wt 178.0 lb

## 2017-04-17 DIAGNOSIS — R12 Heartburn: Secondary | ICD-10-CM | POA: Insufficient documentation

## 2017-04-17 DIAGNOSIS — R1011 Right upper quadrant pain: Secondary | ICD-10-CM | POA: Diagnosis not present

## 2017-04-17 MED ORDER — OMEPRAZOLE 20 MG PO CPDR: 20.0000 mg | DELAYED_RELEASE_CAPSULE | Freq: Every day | ORAL | 1 refills | Status: DC

## 2017-04-17 NOTE — Patient Instructions (Signed)
We have sent the following medications to your pharmacy for you to pick up at your convenience: Omeprazole 20 mg daily  You have been scheduled for a HIDA scan at [facility] (1st floor) on 04/30/17. Please arrive 15 minutes prior to your scheduled appointment at  7:30 am. Make certain not to have anything to eat or drink at least 6 hours prior to your test. Should this appointment date or time not work well for you, please call radiology scheduling at [phone].  _____________________________________________________________________ hepatobiliary (HIDA) scan is an imaging procedure used to diagnose problems in the liver, gallbladder and bile ducts. In the HIDA scan, a radioactive chemical or tracer is injected into a vein in your arm. The tracer is handled by the liver like bile. Bile is a fluid produced and excreted by your liver that helps your digestive system break down fats in the foods you eat. Bile is stored in your gallbladder and the gallbladder releases the bile when you eat a meal. A special nuclear medicine scanner (gamma camera) tracks the flow of the tracer from your liver into your gallbladder and small intestine.  During your HIDA scan  You'll be asked to change into a hospital gown before your HIDA scan begins. Your health care team will position you on a table, usually on your back. The radioactive tracer is then injected into a vein in your arm.The tracer travels through your bloodstream to your liver, where it's taken up by the bile-producing cells. The radioactive tracer travels with the bile from your liver into your gallbladder and through your bile ducts to your small intestine.You may feel some pressure while the radioactive tracer is injected into your vein. As you lie on the table, a special gamma camera is positioned over your abdomen taking pictures of the tracer as it moves through your body. The gamma camera takes pictures continually for about an hour. You'll need  to keep still during the HIDA scan. This can become uncomfortable, but you may find that you can lessen the discomfort by taking deep breaths and thinking about other things. Tell your health care team if you're uncomfortable. The radiologist will watch on a computer the progress of the radioactive tracer through your body. The HIDA scan may be stopped when the radioactive tracer is seen in the gallbladder and enters your small intestine. This typically takes about an hour. In some cases extra imaging will be performed if original images aren't satisfactory, if morphine is given to help visualize the gallbladder or if the medication CCK is given to look at the contraction of the gallbladder. This test typically takes 2 hours to complete. ________________________________________________________________________

## 2017-04-17 NOTE — Progress Notes (Addendum)
04/17/2017 Lindsay Long [id number] April 27, 1961   HISTORY OF PRESENT ILLNESS:  This is a pleasant 56 year old female who is new to our office. She presents to our office today at the request of her PCP, Dr. Clovis Riley, for follow-up of a recent emergency room visit. She tells me that for 6 days in a row she had been awoken in the night with complaints of severe heartburn/indigestion. This was then followed by waking up at night with an episode of severe right upper quadrant abdominal pain that radiated to her back. She says that this pain was very severe, worse in having 3 babies. She presented to the emergency room where ultrasound showed upper limit normal common bile duct at 8 mm and a hepatic cyst. CT scan of the abdomen and pelvis with contrast showed fatty liver and a small fatty umbilical hernia, but was otherwise unremarkable. CBC, CMP, lipase, troponin were all normal except for a very mildly elevated AST of 82.  By the time she had left the emergency department she was feeling better. They did prescribe her omeprazole 20 mg daily and Carafate suspension for 4 times a day. She says that she took the Carafate for about a week religiously, but has not taken that now on several days. She has continued the omeprazole 20 mg daily.  She has not experienced any recurrent symptoms.  She tells me that she had a colonoscopy by Dr. Bosie Clos at [facility] in 2015 was normal at that time and repeat was recommended in 10 years.    Past Medical History:  Diagnosis Date  . Anxiety   . Anxiety 05/31/2016  . Atypical chest pain 05/31/2016  . High cholesterol   . Hyperlipidemia 05/31/2016   Past Surgical History:  Procedure Laterality Date  . APPENDECTOMY    . SHOULDER ARTHROSCOPY Bilateral 2015 & 2016  . TUBAL LIGATION      reports that she quit smoking about 9 years ago. She has never used smokeless tobacco. She reports that she drinks alcohol. She reports that she does not use drugs. family  history includes Alcohol abuse in her sister and sister; Breast cancer in her maternal aunt; CAD in her maternal grandmother and sister; COPD in her mother and sister; CVA in her sister; Depression in her mother; Diabetes in her brother, other, and sister; Diabetes Mellitus I in her father, maternal grandfather, maternal grandmother, mother, and paternal grandmother; Drug abuse in her sister; Heart failure in her mother; Hypertension in her other; Liver disease in her mother; Lung cancer in her paternal grandfather and paternal uncle; Lupus in her sister. Allergies  Allergen Reactions  . Penicillins Hives      Outpatient Encounter Prescriptions as of 04/17/2017  Medication Sig  . ALPRAZolam (XANAX) 0.25 MG tablet Take 0.25 mg by mouth 3 (three) times daily as needed for anxiety.  . diphenhydramine-acetaminophen (TYLENOL PM EXTRA STRENGTH) 25-500 MG TABS tablet Take 1 tablet by mouth at bedtime as needed.  Marland Kitchen omeprazole (PRILOSEC) 20 MG capsule Take 1 capsule (20 mg total) by mouth daily. Take one tablet daily  . fluticasone (FLONASE) 50 MCG/ACT nasal spray as needed.  . sucralfate (CARAFATE) 1 GM/10ML suspension Take 10 mLs (1 g total) by mouth 4 (four) times daily -  with meals and at bedtime. (Patient not taking: Reported on 04/17/2017)   No facility-administered encounter medications on file as of 04/17/2017.      REVIEW OF SYSTEMS  : All other systems reviewed and negative except where  noted in the History of Present Illness.   PHYSICAL EXAM: BP 100/70   Pulse 64   Ht 5\' 4"  (1.626 m)   Wt 178 lb (80.7 kg)   LMP 06/26/2011   BMI 30.55 kg/m  General: Well developed white female in no acute distress Head: Normocephalic and atraumatic Eyes:  Sclerae anicteric, conjunctiva pink. Ears: Normal auditory acuity Lungs: Clear throughout to auscultation; no increased WOB. Heart: Regular rate and rhythm; no M/R/G. Abdomen: Soft, non-distended.  Normal bowel sounds.  Mild RUQ  TTP. Musculoskeletal: Symmetrical with no gross deformities  Skin: No lesions on visible extremities Extremities: No edema  Neurological: Alert oriented x 4, grossly non-focal Psychological:  Alert and cooperative. Normal mood and affect  ASSESSMENT AND PLAN: *56 year old female with complaints of several days of heartburn/indigestion followed by an episode of severe right upper quadrant abdominal pain that radiated to her back. Ultrasound and CT scan unremarkable. This could all be due to reflux, possibly ulcer, but with the episode of abdominal pain that she describes it sounds much more like biliary in nature. Gallbladder issues can also present with symptoms such as indigestion. I think that we are going to start with a HIDA scan with CCK to rule out biliary dyskinesia. She will continue on her omeprazole 20 mg daily for now; will send a new prescription.  **She had a colonoscopy by Dr. Bosie ClosSchooler at [facility] in 2015 that she reports was normal. We will obtain those records as she wishes to follow here in the future.  **Addendum:  Colonoscopy records obtained from Dr. Bosie ClosSchooler.  It was performed in 11/2014 with five 1-3 mm polyps removed from the rectum that were all hyperplastic on pathology.  Also had internal hemorrhoids.   CC:  Lindsay Long, Lindsay Saucerean, MD  Agree with Lindsay Long's management.  If gallbladder ejection fraction is low she can be considered for a cholecystectomy. She might want to wait to see if she has more symptoms however as we are also treating for reflux. I think a period of observation and follow-up with Lindsay Long for me to see how she is before referring to surgery makes sense unless she has persistent episodic biliary colic-like pain. If she does go for surgery she has to understand that it helps one third of people long-term and the other two thirds may have recurrent similar symptoms.  Iva Booparl E. Gessner, MD, Clementeen GrahamFACG

## 2017-04-30 ENCOUNTER — Encounter (HOSPITAL_COMMUNITY)
Admission: RE | Admit: 2017-04-30 | Discharge: 2017-04-30 | Disposition: A | Discharge status: Home / Self Care | Payer: BLUE CROSS/BLUE SHIELD | Source: Ambulatory Visit | Attending: Gastroenterology | Admitting: Gastroenterology

## 2017-04-30 DIAGNOSIS — R1011 Right upper quadrant pain: Secondary | ICD-10-CM | POA: Insufficient documentation

## 2017-04-30 MED ORDER — TECHNETIUM TC 99M MEBROFENIN IV KIT: 5.3000 | PACK | Freq: Once | INTRAVENOUS | Status: DC | PRN

## 2017-06-03 ENCOUNTER — Other Ambulatory Visit: Payer: Self-pay | Admitting: Emergency Medicine

## 2017-06-03 MED ORDER — OMEPRAZOLE 20 MG PO CPDR: 20.0000 mg | DELAYED_RELEASE_CAPSULE | Freq: Every day | ORAL | 1 refills | Status: DC

## 2017-06-04 ENCOUNTER — Other Ambulatory Visit: Payer: Self-pay | Admitting: Emergency Medicine

## 2017-06-04 MED ORDER — OMEPRAZOLE 20 MG PO CPDR: 20.0000 mg | DELAYED_RELEASE_CAPSULE | Freq: Every day | ORAL | 1 refills | Status: DC

## 2017-07-12 DIAGNOSIS — E7800 Pure hypercholesterolemia, unspecified: Secondary | ICD-10-CM | POA: Diagnosis not present

## 2018-01-07 ENCOUNTER — Other Ambulatory Visit (HOSPITAL_COMMUNITY)
Admission: RE | Admit: 2018-01-07 | Discharge: 2018-01-07 | Disposition: A | Discharge status: Home / Self Care | Payer: BLUE CROSS/BLUE SHIELD | Source: Ambulatory Visit | Attending: Family Medicine | Admitting: Family Medicine

## 2018-01-07 ENCOUNTER — Other Ambulatory Visit: Payer: Self-pay | Admitting: Family Medicine

## 2018-01-07 DIAGNOSIS — Z01419 Encounter for gynecological examination (general) (routine) without abnormal findings: Secondary | ICD-10-CM | POA: Diagnosis not present

## 2018-01-07 DIAGNOSIS — Z124 Encounter for screening for malignant neoplasm of cervix: Secondary | ICD-10-CM | POA: Diagnosis not present

## 2018-01-07 DIAGNOSIS — E782 Mixed hyperlipidemia: Secondary | ICD-10-CM | POA: Diagnosis not present

## 2018-01-07 DIAGNOSIS — Z0000 Encounter for general adult medical examination without abnormal findings: Secondary | ICD-10-CM | POA: Diagnosis not present

## 2018-01-08 LAB — CYTOLOGY - PAP: Diagnosis: NEGATIVE

## 2018-03-18 ENCOUNTER — Other Ambulatory Visit: Payer: Self-pay

## 2018-03-18 DIAGNOSIS — Z1231 Encounter for screening mammogram for malignant neoplasm of breast: Secondary | ICD-10-CM

## 2018-04-10 ENCOUNTER — Ambulatory Visit
Admission: RE | Admit: 2018-04-10 | Discharge: 2018-04-10 | Disposition: A | Discharge status: Home / Self Care | Payer: BLUE CROSS/BLUE SHIELD | Source: Ambulatory Visit | Attending: Family Medicine | Admitting: Family Medicine

## 2018-04-10 DIAGNOSIS — Z1231 Encounter for screening mammogram for malignant neoplasm of breast: Secondary | ICD-10-CM | POA: Diagnosis not present

## 2018-07-21 DIAGNOSIS — J069 Acute upper respiratory infection, unspecified: Secondary | ICD-10-CM | POA: Diagnosis not present

## 2018-09-08 ENCOUNTER — Encounter (HOSPITAL_COMMUNITY): Payer: Self-pay | Admitting: *Deleted

## 2018-09-08 ENCOUNTER — Emergency Department (HOSPITAL_COMMUNITY): Payer: BLUE CROSS/BLUE SHIELD

## 2018-09-08 ENCOUNTER — Other Ambulatory Visit: Payer: Self-pay

## 2018-09-08 ENCOUNTER — Emergency Department (HOSPITAL_COMMUNITY)
Admission: EM | Admit: 2018-09-08 | Discharge: 2018-09-08 | Disposition: A | Discharge status: Home / Self Care | Payer: BLUE CROSS/BLUE SHIELD | Attending: Emergency Medicine | Admitting: Emergency Medicine

## 2018-09-08 DIAGNOSIS — I10 Essential (primary) hypertension: Secondary | ICD-10-CM | POA: Diagnosis not present

## 2018-09-08 DIAGNOSIS — Z79899 Other long term (current) drug therapy: Secondary | ICD-10-CM | POA: Insufficient documentation

## 2018-09-08 DIAGNOSIS — R1011 Right upper quadrant pain: Secondary | ICD-10-CM | POA: Diagnosis not present

## 2018-09-08 DIAGNOSIS — Z87891 Personal history of nicotine dependence: Secondary | ICD-10-CM | POA: Diagnosis not present

## 2018-09-08 DIAGNOSIS — R110 Nausea: Secondary | ICD-10-CM | POA: Diagnosis not present

## 2018-09-08 DIAGNOSIS — R079 Chest pain, unspecified: Secondary | ICD-10-CM | POA: Diagnosis not present

## 2018-09-08 LAB — HEPATIC FUNCTION PANEL
ALT: 22 U/L (ref 0–44)
AST: 32 U/L (ref 15–41)
Albumin: 3.8 g/dL (ref 3.5–5.0)
Alkaline Phosphatase: 74 U/L (ref 38–126)
BILIRUBIN TOTAL: 1.1 mg/dL (ref 0.3–1.2)
Bilirubin, Direct: 0.5 mg/dL — ABNORMAL HIGH (ref 0.0–0.2)
Indirect Bilirubin: 0.6 mg/dL (ref 0.3–0.9)
Total Protein: 7.3 g/dL (ref 6.5–8.1)

## 2018-09-08 LAB — CBC
HCT: 41.9 % (ref 36.0–46.0)
Hemoglobin: 13.3 g/dL (ref 12.0–15.0)
MCH: 29.7 pg (ref 26.0–34.0)
MCHC: 31.7 g/dL (ref 30.0–36.0)
MCV: 93.5 fL (ref 80.0–100.0)
Platelets: 272 10*3/uL (ref 150–400)
RBC: 4.48 MIL/uL (ref 3.87–5.11)
RDW: 12.1 % (ref 11.5–15.5)
WBC: 7.1 10*3/uL (ref 4.0–10.5)
nRBC: 0 % (ref 0.0–0.2)

## 2018-09-08 LAB — BASIC METABOLIC PANEL
Anion gap: 12 (ref 5–15)
BUN: 12 mg/dL (ref 6–20)
CALCIUM: 9.6 mg/dL (ref 8.9–10.3)
CO2: 21 mmol/L — ABNORMAL LOW (ref 22–32)
Chloride: 108 mmol/L (ref 98–111)
Creatinine, Ser: 0.8 mg/dL (ref 0.44–1.00)
GFR calc Af Amer: >60 mL/min (ref >60)
GFR calc non Af Amer: >60 mL/min (ref >60)
GLUCOSE: 104 mg/dL — AB (ref 70–99)
Potassium: 4.7 mmol/L (ref 3.5–5.1)
Sodium: 141 mmol/L (ref 135–145)

## 2018-09-08 LAB — I-STAT TROPONIN, ED: Troponin i, poc: 0 ng/mL (ref 0.00–0.08)

## 2018-09-08 LAB — LIPASE, BLOOD: Lipase: 39 U/L (ref 11–51)

## 2018-09-08 MED ORDER — FAMOTIDINE 20 MG PO TABS
20.0000 mg | ORAL_TABLET | Freq: Once | ORAL | Status: AC
  Administered 2018-09-08: 20 mg via ORAL
  Filled 2018-09-08: qty 1

## 2018-09-08 MED ORDER — SODIUM CHLORIDE 0.9% FLUSH: 3.0000 mL | Freq: Once | INTRAVENOUS | Status: DC

## 2018-09-08 NOTE — ED Provider Notes (Signed)
[name] EMERGENCY DEPARTMENT Provider Note   CSN: [id number] Arrival date & time: 09/08/18  1121     History   Chief Complaint Chief Complaint  Patient presents with  . Chest Pain    HPI [name] is a 58 y.o. female.  Patient is a 58 year old female who presents with upper abdominal pain.  She has a history of hyperlipidemia.  She states that around 1030 this morning she woke up with pain in her epigastrium radiating to her right upper quadrant and around to her back.  She had some nausea but no vomiting.  She denies any shortness of breath.  No pain in her chest.  She had a similar episode about a year ago and she saw a gastroenterologist who felt that it was her gallbladder although she said she had a normal ultrasound and a normal what sounds like a HIDA scan.  She says that her pain has eased off now.  She denies any fevers.  No change in bowels.  No urinary symptoms.  She states she has not changed anything in her diet recently.     Past Medical History:  Diagnosis Date  . Anxiety   . Anxiety 05/31/2016  . Atypical chest pain 05/31/2016  . High cholesterol   . Hyperlipidemia 05/31/2016    Patient Active Problem List   Diagnosis Date Noted  . Right upper quadrant pain 04/17/2017  . Heartburn 04/17/2017  . Hyperlipidemia 05/31/2016  . Anxiety 05/31/2016  . Atypical chest pain 05/31/2016    Past Surgical History:  Procedure Laterality Date  . APPENDECTOMY    . SHOULDER ARTHROSCOPY Bilateral 2015 & 2016  . TUBAL LIGATION       OB History   No obstetric history on file.      Home Medications    Prior to Admission medications   Medication Sig Start Date End Date Taking? Authorizing Provider  ALPRAZolam (XANAX) 0.25 MG tablet Take 0.25 mg by mouth 3 (three) times daily as needed for anxiety.    [provider]  diphenhydramine-acetaminophen (TYLENOL PM EXTRA STRENGTH) 25-500 MG TABS tablet Take 1 tablet by mouth at bedtime  as needed.    [provider]  fluticasone (FLONASE) 50 MCG/ACT nasal spray as needed. 02/15/17   [provider]  omeprazole (PRILOSEC) 20 MG capsule Take 1 capsule (20 mg total) by mouth daily. Take one tablet daily 06/04/17   Zehr, Shanda Bumps D, PA-C  sucralfate (CARAFATE) 1 GM/10ML suspension Take 10 mLs (1 g total) by mouth 4 (four) times daily -  with meals and at bedtime. Patient not taking: Reported on 04/17/2017 03/25/17   Gerhard Munch, MD    Family History Family History  Problem Relation Age of Onset  . Diabetes Other   . Hypertension Other   . Depression Mother   . Diabetes Mellitus I Mother   . Heart failure Mother   . COPD Mother   . Liver disease Mother   . Diabetes Mellitus I Father   . Lupus Sister   . CVA Sister   . Alcohol abuse Sister   . Diabetes Brother   . Diabetes Mellitus I Maternal Grandmother   . CAD Maternal Grandmother   . Diabetes Mellitus I Maternal Grandfather   . Diabetes Mellitus I Paternal Grandmother   . Lung cancer Paternal Grandfather   . Drug abuse Sister   . Alcohol abuse Sister   . CAD Sister   . COPD Sister   . Diabetes Sister   .  Lung cancer Paternal Uncle   . Breast cancer Maternal Aunt   . Colon cancer Neg Hx   . Colon polyps Neg Hx     Social History Social History   Tobacco Use  . Smoking status: Former Smoker    Last attempt to quit: 04/12/2008    Years since quitting: 10.4  . Smokeless tobacco: Never Used  Substance Use Topics  . Alcohol use: Yes    Comment: occational  . Drug use: No     Allergies   Penicillins   Review of Systems Review of Systems  Constitutional: Negative for chills, diaphoresis, fatigue and fever.  HENT: Negative for congestion, rhinorrhea and sneezing.   Eyes: Negative.   Respiratory: Negative for cough, chest tightness and shortness of breath.   Cardiovascular: Negative for chest pain and leg swelling.  Gastrointestinal: Positive for abdominal pain and nausea. Negative  for blood in stool, diarrhea and vomiting.  Genitourinary: Negative for difficulty urinating, flank pain, frequency and hematuria.  Musculoskeletal: Negative for arthralgias and back pain.  Skin: Negative for rash.  Neurological: Negative for dizziness, speech difficulty, weakness, numbness and headaches.     Physical Exam Updated Vital Signs BP 120/67   Pulse (!) 56   Temp 98.6 F (37 C) (Oral)   Resp 11   LMP 06/26/2011   SpO2 100%   Physical Exam Constitutional:      Appearance: She is well-developed.  HENT:     Head: Normocephalic and atraumatic.  Eyes:     Pupils: Pupils are equal, round, and reactive to light.  Neck:     Musculoskeletal: Normal range of motion and neck supple.  Cardiovascular:     Rate and Rhythm: Normal rate and regular rhythm.     Heart sounds: Normal heart sounds.  Pulmonary:     Effort: Pulmonary effort is normal. No respiratory distress.     Breath sounds: Normal breath sounds. No wheezing or rales.  Chest:     Chest wall: No tenderness.  Abdominal:     General: Bowel sounds are normal.     Palpations: Abdomen is soft.     Tenderness: There is abdominal tenderness (Tenderness of the right upper quadrant). There is no guarding or rebound.  Musculoskeletal: Normal range of motion.  Lymphadenopathy:     Cervical: No cervical adenopathy.  Skin:    General: Skin is warm and dry.     Findings: No rash.  Neurological:     Mental Status: She is alert and oriented to person, place, and time.      ED Treatments / Results  Labs (all labs ordered are listed, but only abnormal results are displayed) Labs Reviewed  BASIC METABOLIC PANEL - Abnormal; Notable for the following components:      Result Value   CO2 21 (*)    Glucose, Bld 104 (*)    All other components within normal limits  HEPATIC FUNCTION PANEL - Abnormal; Notable for the following components:   Bilirubin, Direct 0.5 (*)    All other components within normal limits  CBC    LIPASE, BLOOD  I-STAT TROPONIN, ED    EKG EKG Interpretation  Date/Time:  Monday September 08 2018 11:24:44 EST Ventricular Rate:  60 PR Interval:  170 QRS Duration: 78 QT Interval:  412 QTC Calculation: 412 R Axis:   45 Text Interpretation:  Normal sinus rhythm Normal ECG since last tracing no significant change Confirmed by Rolan Bucco [phone]) on 09/08/2018 6:42:01 PM   Radiology Dg  Chest 2 View  Result Date: 09/08/2018 CLINICAL DATA:  Chest pain. EXAM: CHEST - 2 VIEW COMPARISON:  07/10/2016 FINDINGS: The heart size and mediastinal contours are within normal limits. Both lungs are clear. The visualized skeletal structures are unremarkable. IMPRESSION: No active cardiopulmonary disease. Electronically Signed   By: Signa Kellaylor  Stroud M.D.   On: 09/08/2018 11:58   [place] Abdomen Limited Ruq  Result Date: 09/08/2018 CLINICAL DATA:  Right upper quadrant/epigastric pain. EXAM: ULTRASOUND ABDOMEN LIMITED RIGHT UPPER QUADRANT COMPARISON:  None. FINDINGS: Gallbladder: No gallstones or wall thickening visualized. No sonographic Murphy sign noted by sonographer. Common bile duct: Diameter: 5.0 mm Liver: No focal lesion identified. Within normal limits in parenchymal echogenicity. Portal vein is patent on color Doppler imaging with normal direction of blood flow towards the liver. IMPRESSION: Normal study.  No cause for pain identified. Electronically Signed   By: Gerome Samavid  Williams III M.D   On: 09/08/2018 19:49    Procedures Procedures (including critical care time)  Medications Ordered in ED Medications  sodium chloride flush (NS) 0.9 % injection 3 mL (has no administration in time range)  famotidine (PEPCID) tablet 20 mg (20 mg Oral Given 09/08/18 1917)     Initial Impression / Assessment and Plan / ED Course  I have reviewed the triage vital signs and the nursing notes.  Pertinent labs & imaging results that were available during my care of the patient were reviewed by me and considered in my  medical decision making (see chart for details).     Patient presents with epigastric and right upper quadrant pain.  Her pain had resolved on arrival to the ED.  She still has some tenderness palpation of the right upper quadrant.  She had a gallbladder ultrasound that was negative.  Her labs are non-concerning.  She is pain-free now.  Her symptoms sound consistent with her prior "gallbladder attack".  She does not have any symptoms that sound more concerning for ACS.  She was discharged home in good condition.  She was encouraged to follow-up with her gastroenterologist.  She will take her Prilosec more consistently over the next week.  Return precautions were given.  Final Clinical Impressions(s) / ED Diagnoses   Final diagnoses:  RUQ pain    ED Discharge Orders    None       Rolan BuccoBelfi, Brylinn Teaney, MD 09/08/18 2105

## 2018-09-08 NOTE — ED Notes (Signed)
Patient verbalizes understanding of discharge instructions. Opportunity for questioning and answers were provided. Armband removed by staff, pt discharged from ED ambulatory to home.  

## 2018-09-08 NOTE — ED Notes (Signed)
Main lab called about labs. They are completing them now

## 2018-09-08 NOTE — ED Triage Notes (Signed)
Pt reports she was awakened with pain in her epigastric/mid chest area that radiates to her back around 1030 today.  Pain is worse with inspiration.  She describes the pain as burning and constant.  She reports taking antacid as soon as the pain started without relief.  No SOB, nausea or dizziness.  She is A&O x 4.  In NAD.

## 2018-09-08 NOTE — ED Notes (Signed)
Patient transported to Ultrasound 

## 2018-09-10 ENCOUNTER — Ambulatory Visit (INDEPENDENT_AMBULATORY_CARE_PROVIDER_SITE_OTHER): Payer: BLUE CROSS/BLUE SHIELD | Admitting: Physician Assistant

## 2018-09-10 ENCOUNTER — Encounter: Payer: Self-pay | Admitting: Physician Assistant

## 2018-09-10 VITALS — BP 116/60 | HR 59 | Ht 64.0 in | Wt 180.0 lb

## 2018-09-10 DIAGNOSIS — R12 Heartburn: Secondary | ICD-10-CM | POA: Diagnosis not present

## 2018-09-10 DIAGNOSIS — R1011 Right upper quadrant pain: Secondary | ICD-10-CM

## 2018-09-10 DIAGNOSIS — R1013 Epigastric pain: Secondary | ICD-10-CM

## 2018-09-10 MED ORDER — OMEPRAZOLE 20 MG PO CPDR: DELAYED_RELEASE_CAPSULE | ORAL | 2 refills | Status: AC

## 2018-09-10 MED ORDER — FAMOTIDINE 20 MG PO TABS: 20.0000 mg | ORAL_TABLET | Freq: Two times a day (BID) | ORAL | Status: AC

## 2018-09-10 MED ORDER — OMEPRAZOLE 20 MG PO CPDR: DELAYED_RELEASE_CAPSULE | ORAL | 1 refills | Status: DC

## 2018-09-10 NOTE — Patient Instructions (Signed)
If you are age 56 or older, your body mass index should be between 23-30. Your Body mass index is 30.9 kg/m. If this is out of the aforementioned range listed, please consider follow up with your Primary Care Provider.  If you are age 29 or younger, your body mass index should be between 19-25. Your Body mass index is 30.9 kg/m. If this is out of the aformentioned range listed, please consider follow up with your Primary Care Provider.    We have sent the following medications to your pharmacy for you to pick up at your convenience:  Omeprazole   Use Pepcid over the counter as needed for breakthrough.   Thank you for choosing me and [facility].  Hyacinth Meeker -PA

## 2018-09-10 NOTE — Progress Notes (Addendum)
Chief Complaint: Epigastric/right upper quadrant pain and heartburn  HPI:    Lindsay Long is a 58 year old Caucasian female with a past medical history as listed below, known to Dr. Leone Payor, who presents to clinic today for complaint of epigastric/right upper quadrant pain and heartburn.      Colonoscopy at [facility] in April 2015 with five 1-3 mm polyps removed from the rectum that were all hyperplastic, also internal hemorrhoids.  Repeat recommended in 10 years.    04/17/2017 office visit with Doug Sou, PA to discuss heartburn/indigestion followed by episodes of severe right upper quadrant abdominal pain at that time ultrasound and CT scan unremarkable.  Patient had a HIDA scan with CCK which was also normal.  She was continued on Omeprazole 20 mg daily.    09/08/2018 patient seen in the ED for right upper quadrant pain.  Had labs including a BMP, hepatic function panel, CBC and lipase which were all normal.  Right upper quadrant ultrasound at that time was normal.    Today, patient explains that earlier this week 09/07/2026 she started with an epigastric/right upper quadrant pain that was rated as a 9-10/10.  This prompted her visit to the ED as above.  This was accompanied by heartburn and some indigestion.  Patient tells me that occasionally she will have episodes of the same pain but she will just take an Omeprazole as needed 20 mg, and lay on her left side and typically this pain will go away, but this day it did not.  Does admit she has had extra stress in her life with burying her stepfather recently.  Tells me she does not like to be on medicine every day which is why she has not been taking her Omeprazole consistently.  She would prefer to take it as needed due to possible side effects from this medication.  Also admits to starting caffeine back last week after having been off of it for a month or more.  In general has a pretty healthy diet.    Denies fever, chills, weight loss, anorexia,  nausea, vomiting or change in bowel habits.  Past Medical History:  Diagnosis Date  . Anxiety   . Anxiety 05/31/2016  . Atypical chest pain 05/31/2016  . High cholesterol   . Hyperlipidemia 05/31/2016    Past Surgical History:  Procedure Laterality Date  . APPENDECTOMY    . SHOULDER ARTHROSCOPY Bilateral 2015 & 2016  . TUBAL LIGATION      Current Outpatient Medications  Medication Sig Dispense Refill  . ALPRAZolam (XANAX) 0.25 MG tablet Take 0.25 mg by mouth 3 (three) times daily as needed for anxiety.    . diphenhydramine-acetaminophen (TYLENOL PM EXTRA STRENGTH) 25-500 MG TABS tablet Take 1 tablet by mouth at bedtime as needed.    . fluticasone (FLONASE) 50 MCG/ACT nasal spray as needed.  0  . omeprazole (PRILOSEC) 20 MG capsule Take 1 capsule (20 mg total) by mouth daily. Take one tablet daily 90 capsule 1   No current facility-administered medications for this visit.     Allergies as of 09/10/2018 - Review Complete 09/10/2018  Allergen Reaction Noted  . Penicillins Hives 07/23/2011    Family History  Problem Relation Age of Onset  . Diabetes Other   . Hypertension Other   . Depression Mother   . Diabetes Mellitus I Mother   . Heart failure Mother   . COPD Mother   . Liver disease Mother   . Diabetes Mellitus I Father   .  Lupus Sister   . CVA Sister   . Alcohol abuse Sister   . Diabetes Brother   . Diabetes Mellitus I Maternal Grandmother   . CAD Maternal Grandmother   . Diabetes Mellitus I Maternal Grandfather   . Diabetes Mellitus I Paternal Grandmother   . Lung cancer Paternal Grandfather   . Drug abuse Sister   . Alcohol abuse Sister   . CAD Sister   . COPD Sister   . Diabetes Sister   . Lung cancer Paternal Uncle   . Breast cancer Maternal Aunt   . Colon cancer Neg Hx   . Colon polyps Neg Hx     Social History   Socioeconomic History  . Marital status: Married    Spouse name: Not on file  . Number of children: 3  . Years of education: 5712    . Highest education level: Not on file  Occupational History  . Not on file  Social Needs  . Financial resource strain: Not on file  . Food insecurity:    Worry: Not on file    Inability: Not on file  . Transportation needs:    Medical: Not on file    Non-medical: Not on file  Tobacco Use  . Smoking status: Former Smoker    Last attempt to quit: 04/12/2008    Years since quitting: 10.4  . Smokeless tobacco: Never Used  Substance and Sexual Activity  . Alcohol use: Yes    Comment: occational  . Drug use: No  . Sexual activity: Never  Lifestyle  . Physical activity:    Days per week: Not on file    Minutes per session: Not on file  . Stress: Not on file  Relationships  . Social connections:    Talks on phone: Not on file    Gets together: Not on file    Attends religious service: Not on file    Active member of club or organization: Not on file    Attends meetings of clubs or organizations: Not on file    Relationship status: Not on file  . Intimate partner violence:    Fear of current or ex partner: Not on file    Emotionally abused: Not on file    Physically abused: Not on file    Forced sexual activity: Not on file  Other Topics Concern  . Not on file  Social History Narrative  . Not on file    Review of Systems:    Constitutional: No weight loss, fever or chills Cardiovascular: No chest pain Respiratory: No SOB Gastrointestinal: See HPI and otherwise negative   Physical Exam:  Vital signs: BP 116/60   Pulse (!) 59   Ht 5\' 4"  (1.626 m)   Wt 180 lb (81.6 kg)   LMP 06/26/2011   BMI 30.90 kg/m   Constitutional:   Pleasant Caucasian female appears to be in NAD, Well developed, Well nourished, alert and cooperative Respiratory: Respirations even and unlabored. Lungs clear to auscultation bilaterally.   No wheezes, crackles, or rhonchi.  Cardiovascular: Normal S1, S2. No MRG. Regular rate and rhythm. No peripheral edema, cyanosis or pallor.   Gastrointestinal:  Soft, nondistended, mild epigastric and RUQ ttp No rebound or guarding. Normal bowel sounds. No appreciable masses or hepatomegaly. Psychiatric: Demonstrates good judgement and reason without abnormal affect or behaviors.  RELEVANT LABS AND IMAGING: CBC    Component Value Date/Time   WBC 7.1 09/08/2018 1138   RBC 4.48 09/08/2018 1138   HGB  13.3 09/08/2018 1138   HCT 41.9 09/08/2018 1138   PLT 272 09/08/2018 1138   MCV 93.5 09/08/2018 1138   MCH 29.7 09/08/2018 1138   MCHC 31.7 09/08/2018 1138   RDW 12.1 09/08/2018 1138    CMP     Component Value Date/Time   NA 141 09/08/2018 1138   K 4.7 09/08/2018 1138   CL 108 09/08/2018 1138   CO2 21 (L) 09/08/2018 1138   GLUCOSE 104 (H) 09/08/2018 1138   BUN 12 09/08/2018 1138   CREATININE 0.80 09/08/2018 1138   CREATININE 0.91 05/31/2016 0949   CALCIUM 9.6 09/08/2018 1138   PROT 7.3 09/08/2018 2000   ALBUMIN 3.8 09/08/2018 2000   AST 32 09/08/2018 2000   ALT 22 09/08/2018 2000   ALKPHOS 74 09/08/2018 2000   BILITOT 1.1 09/08/2018 2000   GFRNONAA >60 09/08/2018 1138   GFRAA >60 09/08/2018 1138    Assessment: 1.  Upper quadrant/epigastric abdominal pain: 9-10/10 over the past week, some better with Omeprazole, previous episodes of similar pain with full gallbladder work-up which was normal; likely related to gastritis/PUD 2.  GERD: With above  Plan: 1.  With previous work-up negative for gallbladder etiology and recent right upper quadrant ultrasound showing a normal gallbladder with normal labs, likely patient's symptoms are related to gastritis.  Offered patient the option of doing an EGD at this time but she declined. 2.  Recommend she take her Omeprazole 20 mg every morning, 30-60 minutes before breakfast for at least 2 weeks in a row.  After this time she can use it again as needed or use Pepcid in its place.  If she continues with pain after this time would recommend an EGD for further eval. 3.  Reviewed  antireflux diet and lifestyle modifications. 4.  Patient to follow in clinic with me or Dr. Leone PayorGessner as needed in the future.  Hyacinth MeekerJennifer Donzell Coller, PA-C [facility] 09/10/2018, 8:39 AM  Cc: Mitchell, L.August Saucerean, MD   Agree with Ms. Terressa Evola's evaluation and management.  Iva Booparl E. Gessner, MD, Clementeen GrahamFACG

## 2018-10-15 DIAGNOSIS — B070 Plantar wart: Secondary | ICD-10-CM | POA: Diagnosis not present

## 2019-01-15 DIAGNOSIS — Z0000 Encounter for general adult medical examination without abnormal findings: Secondary | ICD-10-CM | POA: Diagnosis not present

## 2019-01-23 DIAGNOSIS — R635 Abnormal weight gain: Secondary | ICD-10-CM | POA: Diagnosis not present

## 2019-01-23 DIAGNOSIS — E782 Mixed hyperlipidemia: Secondary | ICD-10-CM | POA: Diagnosis not present

## 2019-04-28 ENCOUNTER — Other Ambulatory Visit: Payer: Self-pay | Admitting: Family Medicine

## 2019-04-28 DIAGNOSIS — Z1231 Encounter for screening mammogram for malignant neoplasm of breast: Secondary | ICD-10-CM

## 2019-06-12 ENCOUNTER — Ambulatory Visit
Admission: RE | Admit: 2019-06-12 | Discharge: 2019-06-12 | Disposition: A | Discharge status: Home / Self Care | Payer: BC Managed Care – PPO | Source: Ambulatory Visit | Attending: Family Medicine | Admitting: Family Medicine

## 2019-06-12 ENCOUNTER — Other Ambulatory Visit: Payer: Self-pay

## 2019-06-12 DIAGNOSIS — Z1231 Encounter for screening mammogram for malignant neoplasm of breast: Secondary | ICD-10-CM | POA: Diagnosis not present

## 2019-07-20 DIAGNOSIS — E7800 Pure hypercholesterolemia, unspecified: Secondary | ICD-10-CM | POA: Diagnosis not present

## 2019-07-20 DIAGNOSIS — Z23 Encounter for immunization: Secondary | ICD-10-CM | POA: Diagnosis not present

## 2019-11-02 ENCOUNTER — Telehealth: Payer: Self-pay | Admitting: Orthopaedic Surgery

## 2019-11-02 NOTE — Telephone Encounter (Signed)
error 

## 2019-12-03 ENCOUNTER — Other Ambulatory Visit: Payer: Self-pay | Admitting: Gastroenterology

## 2019-12-10 DIAGNOSIS — K219 Gastro-esophageal reflux disease without esophagitis: Secondary | ICD-10-CM | POA: Diagnosis not present

## 2019-12-10 DIAGNOSIS — L820 Inflamed seborrheic keratosis: Secondary | ICD-10-CM | POA: Diagnosis not present

## 2019-12-14 IMAGING — US US ABDOMEN LIMITED
1 series · 14 of 25 positions shown · non-contrast
Comparison: None.

CLINICAL DATA: Right upper quadrant/epigastric pain.

EXAM:
ULTRASOUND ABDOMEN LIMITED RIGHT UPPER QUADRANT

[Series 1: us abdomen limited · 14 of 51 slices shown]
[im 1/51]
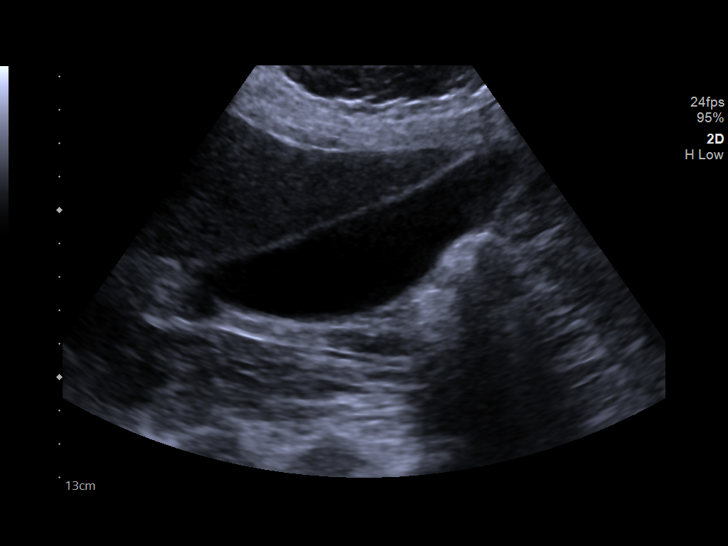
[im 5/51]
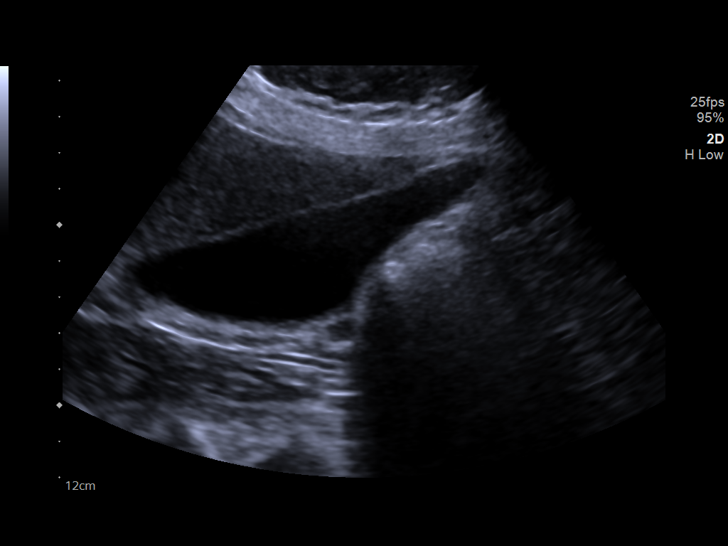
[im 9/51]
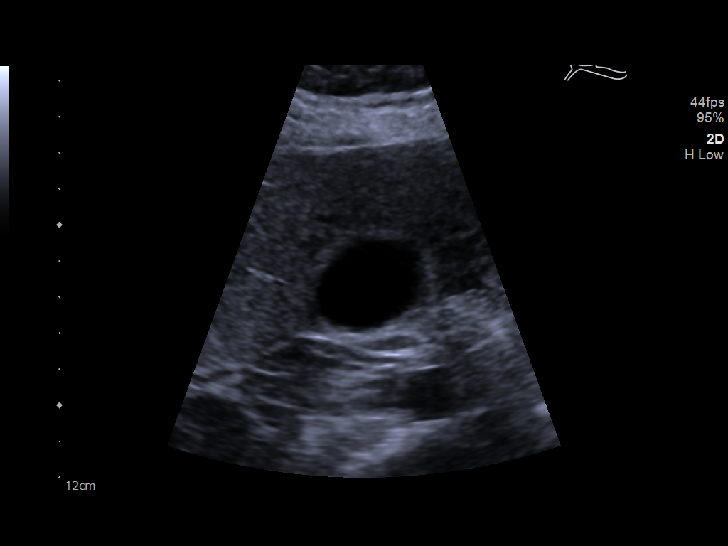
[im 13/51]
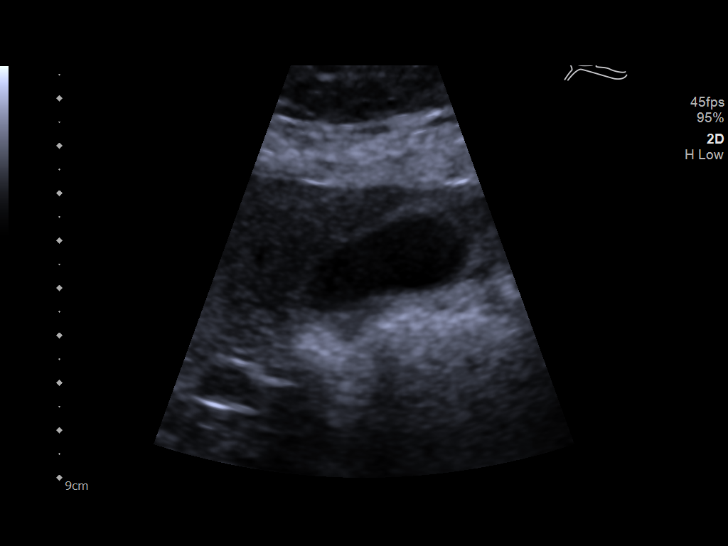
[im 17/51]
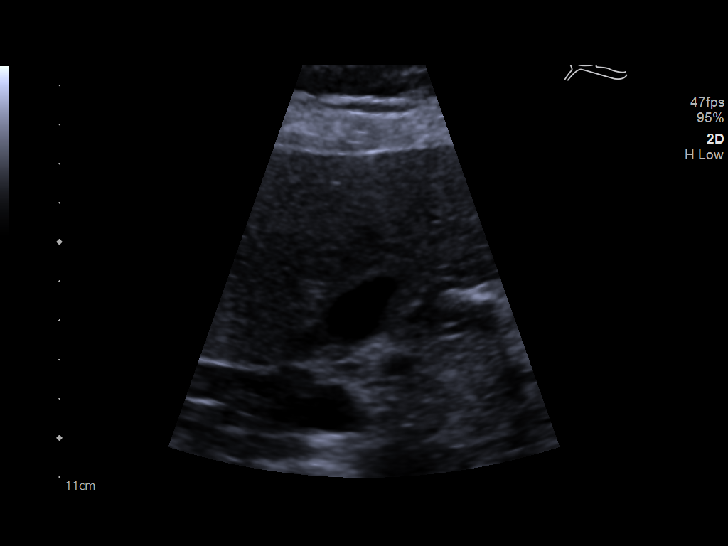
[im 19/51]
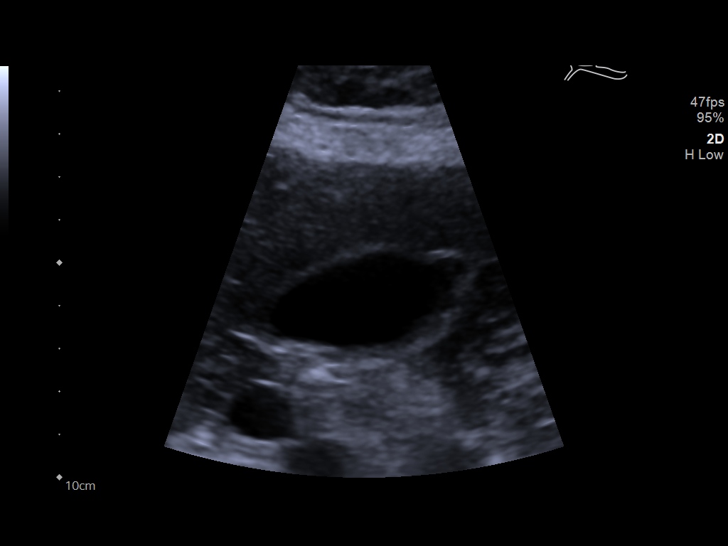
[im 23/51]
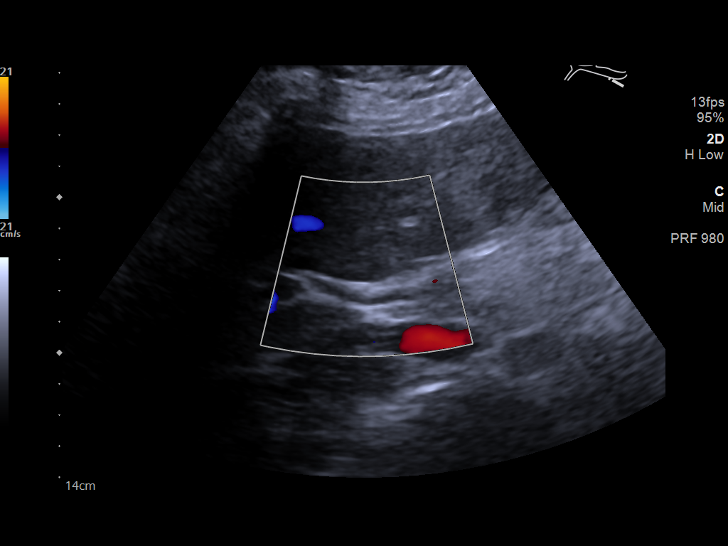
[im 28/51]
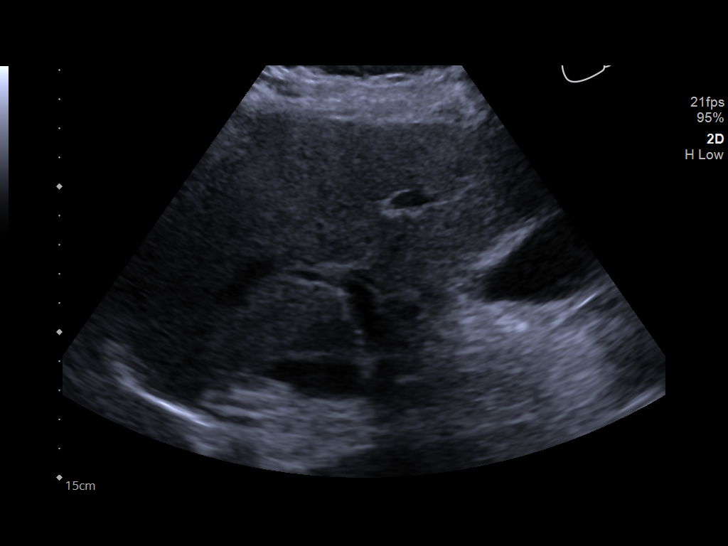
[im 32/51]
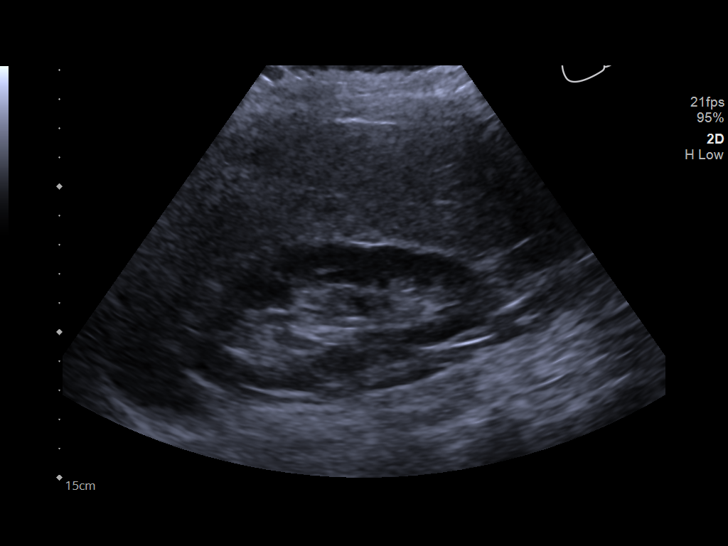
[im 34/51]
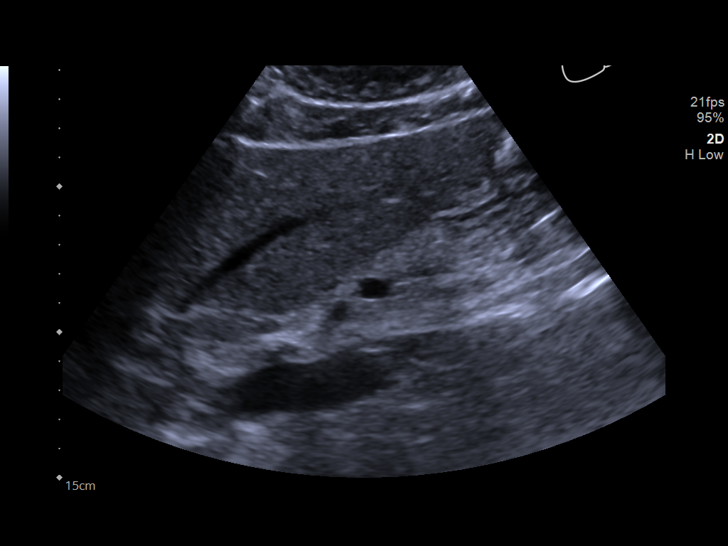
[im 38/51]
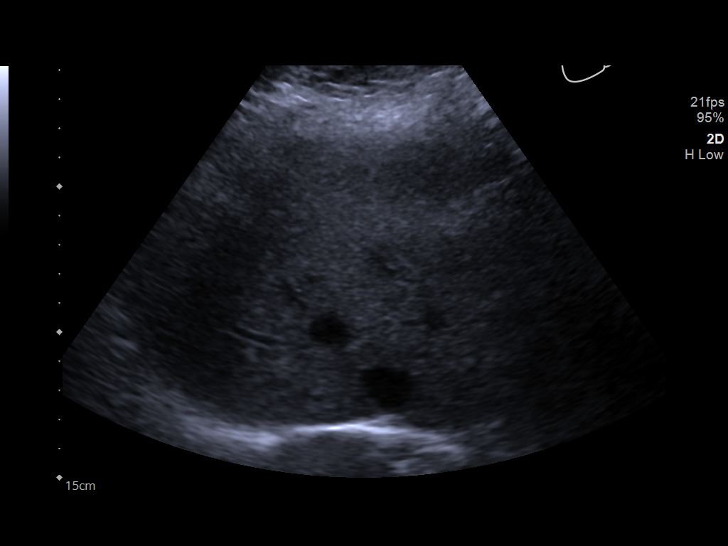
[im 42/51]
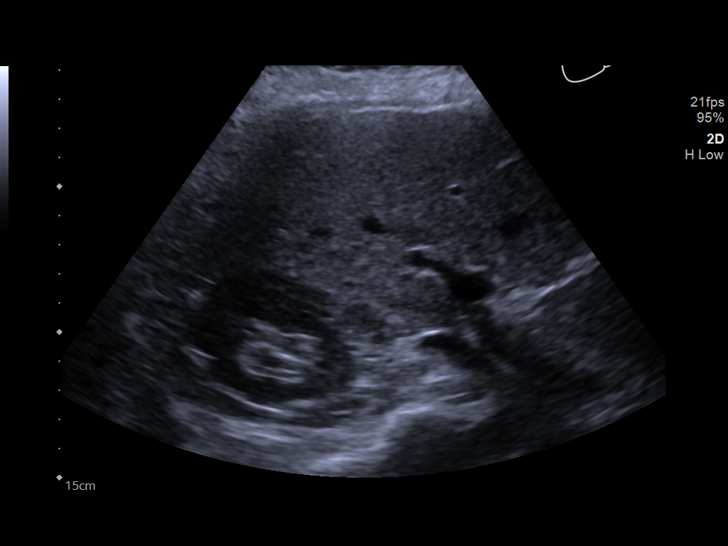
[im 46/51]
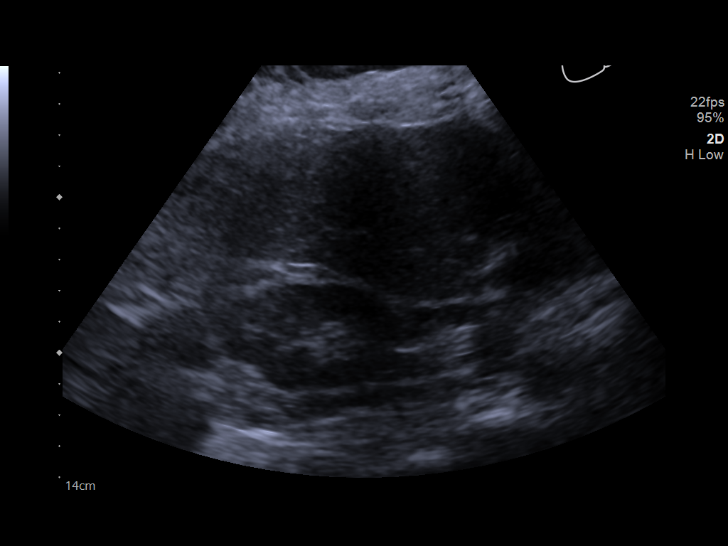
[im 51/51]
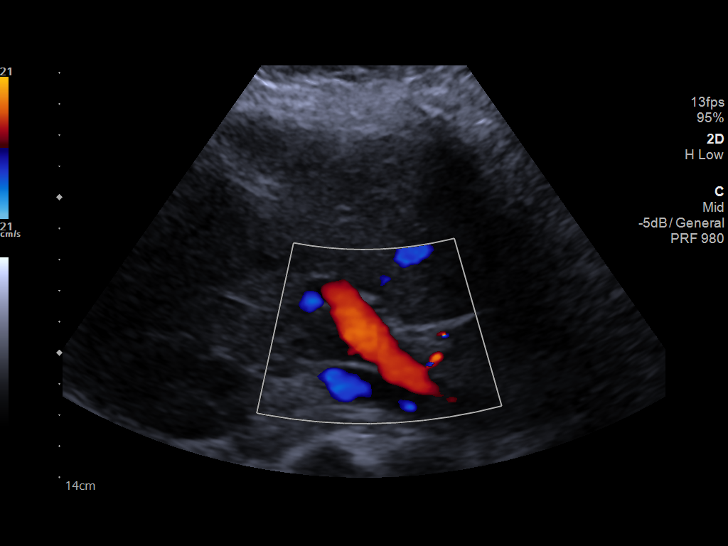

[14 of 25 positions shown; findings below may reference images not displayed]

FINDINGS: Gallbladder:

No gallstones or wall thickening visualized. No sonographic Murphy
sign noted by sonographer.

Common bile duct:

Diameter: 5.0 mm

Liver:

No focal lesion identified. Within normal limits in parenchymal
echogenicity. Portal vein is patent on color Doppler imaging with
normal direction of blood flow towards the liver.
IMPRESSION: Normal study.  No cause for pain identified.

## 2019-12-14 IMAGING — DX DG CHEST 2V
2 series · 2 of 2 positions shown · non-contrast
Comparison: 07/10/2016

CLINICAL DATA: Chest pain.

EXAM:
CHEST - 2 VIEW

[chest pa]
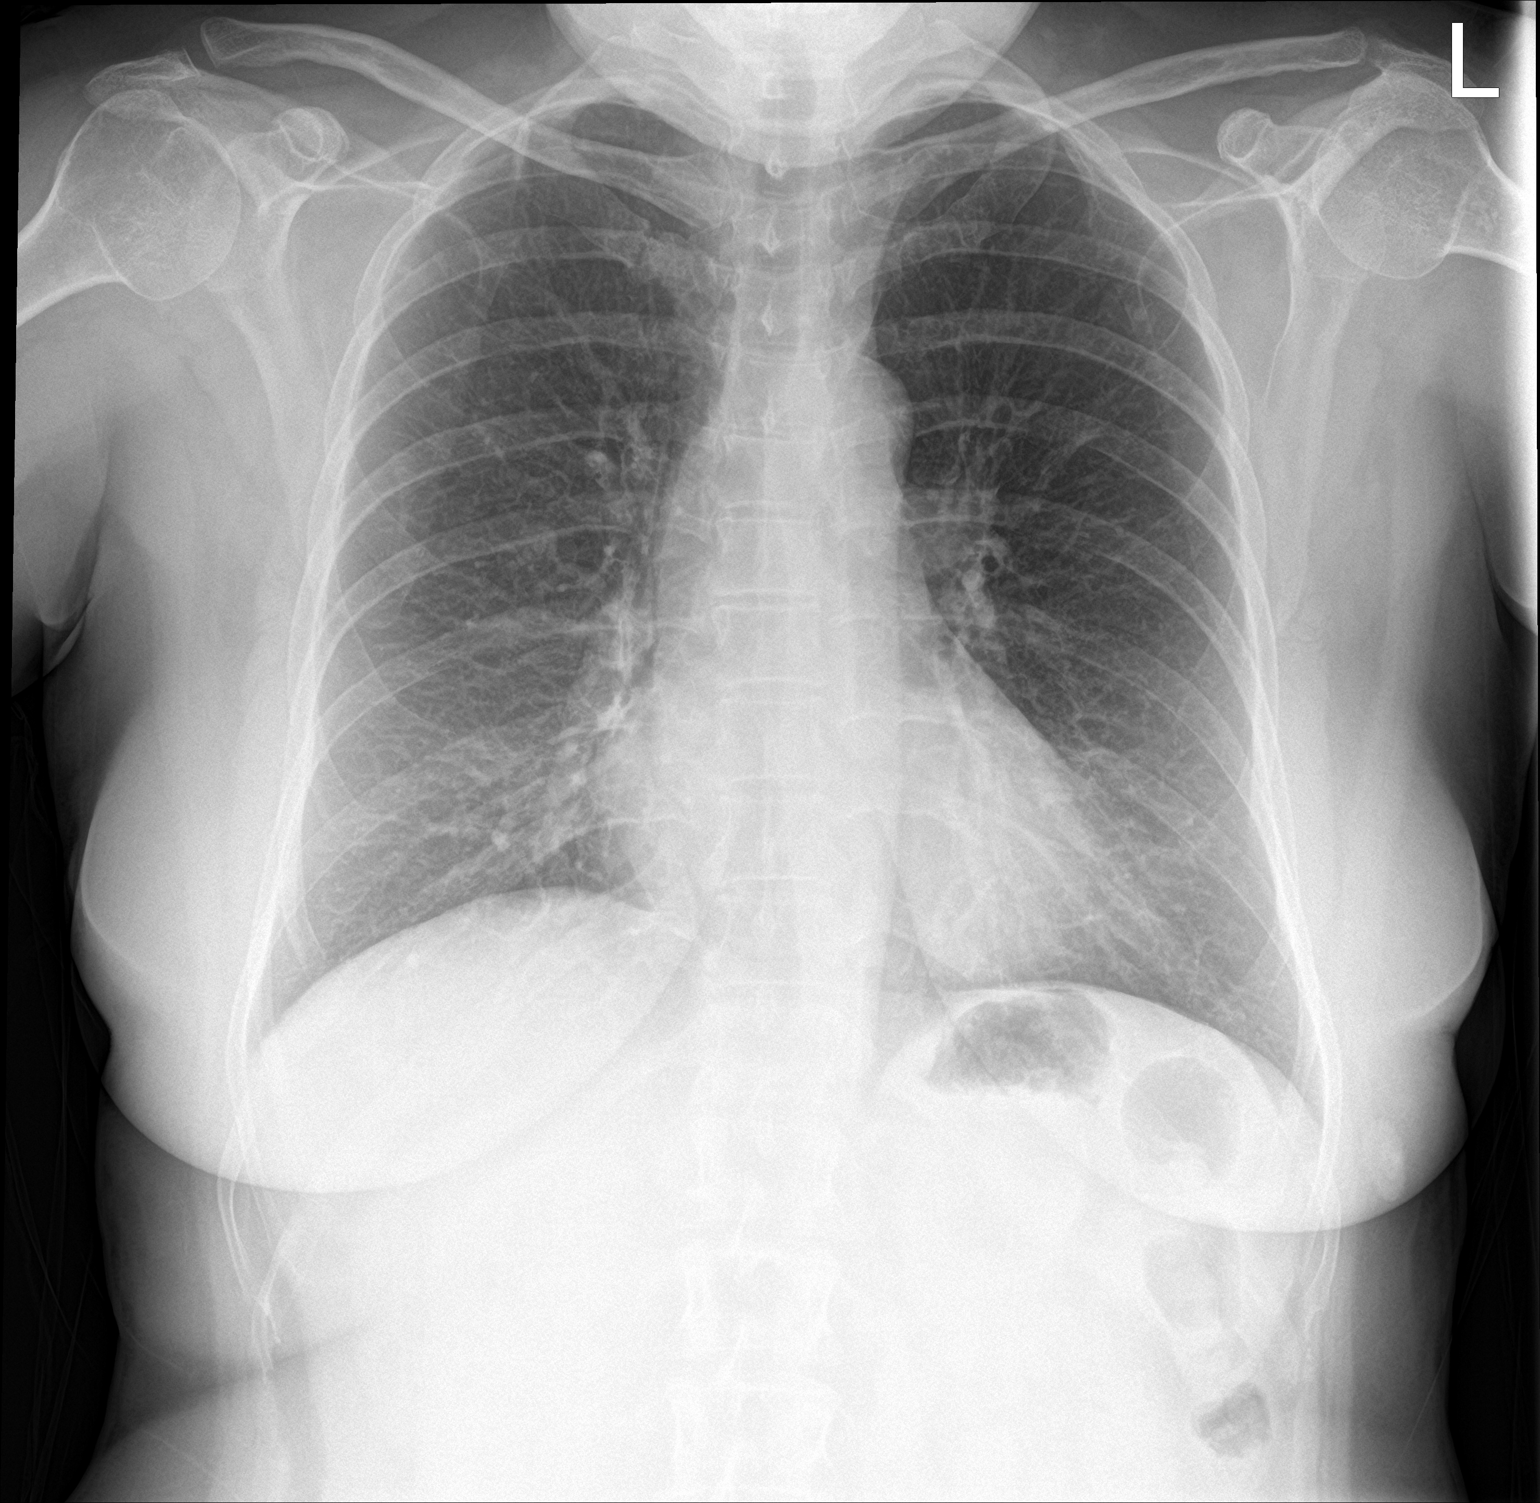

[chest lat]
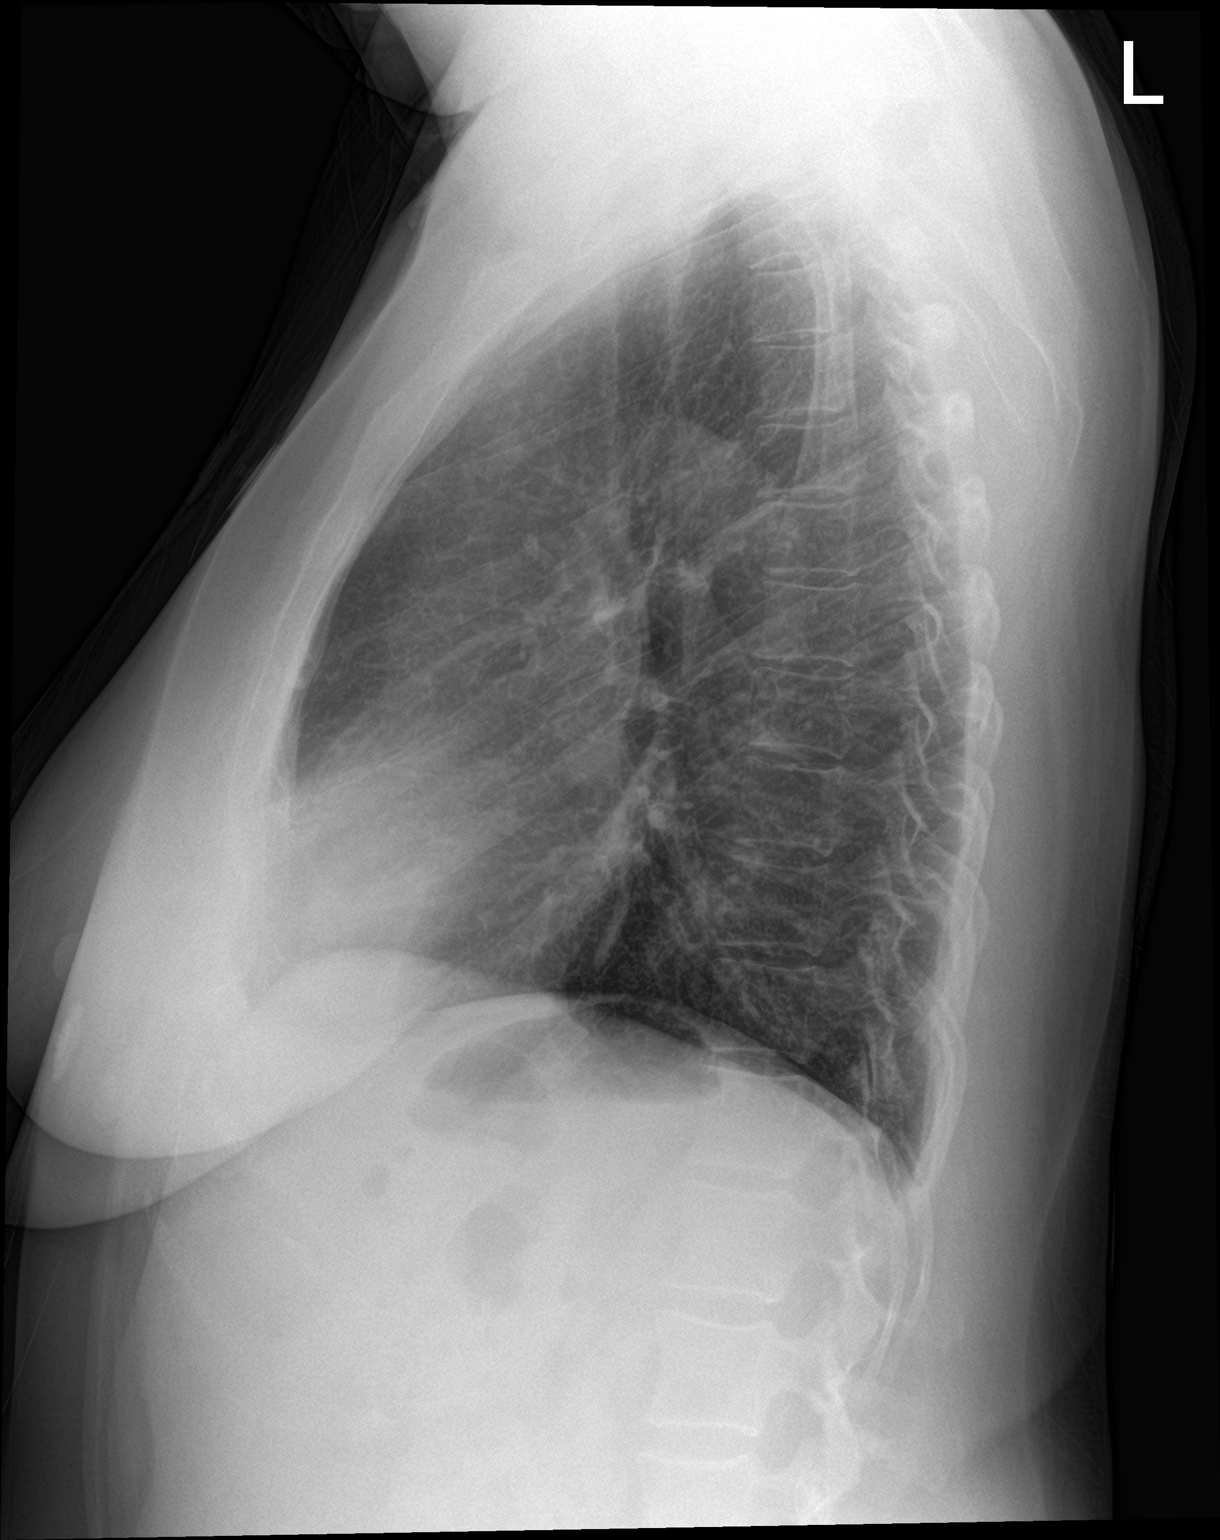

[2 of 2 positions shown; findings below may reference images not displayed]

FINDINGS: The heart size and mediastinal contours are within normal limits.
Both lungs are clear. The visualized skeletal structures are
unremarkable.
IMPRESSION: No active cardiopulmonary disease.

## 2019-12-21 ENCOUNTER — Other Ambulatory Visit: Payer: Self-pay | Admitting: Family Medicine

## 2019-12-21 DIAGNOSIS — Z1231 Encounter for screening mammogram for malignant neoplasm of breast: Secondary | ICD-10-CM

## 2020-02-09 DIAGNOSIS — Z0000 Encounter for general adult medical examination without abnormal findings: Secondary | ICD-10-CM | POA: Diagnosis not present

## 2020-02-09 DIAGNOSIS — E782 Mixed hyperlipidemia: Secondary | ICD-10-CM | POA: Diagnosis not present

## 2020-06-13 ENCOUNTER — Other Ambulatory Visit: Payer: Self-pay

## 2020-06-13 ENCOUNTER — Ambulatory Visit
Admission: RE | Admit: 2020-06-13 | Discharge: 2020-06-13 | Disposition: A | Discharge status: Home / Self Care | Payer: BC Managed Care – PPO | Source: Ambulatory Visit | Attending: Family Medicine | Admitting: Family Medicine

## 2020-06-13 DIAGNOSIS — Z1231 Encounter for screening mammogram for malignant neoplasm of breast: Secondary | ICD-10-CM

## 2020-06-16 ENCOUNTER — Other Ambulatory Visit: Payer: Self-pay | Admitting: Family Medicine

## 2020-06-16 DIAGNOSIS — R928 Other abnormal and inconclusive findings on diagnostic imaging of breast: Secondary | ICD-10-CM

## 2020-06-29 ENCOUNTER — Ambulatory Visit
Admission: RE | Admit: 2020-06-29 | Discharge: 2020-06-29 | Disposition: A | Discharge status: Home / Self Care | Payer: BC Managed Care – PPO | Source: Ambulatory Visit | Attending: Family Medicine | Admitting: Family Medicine

## 2020-06-29 ENCOUNTER — Other Ambulatory Visit: Payer: Self-pay

## 2020-06-29 ENCOUNTER — Other Ambulatory Visit: Payer: Self-pay | Admitting: Family Medicine

## 2020-06-29 DIAGNOSIS — R928 Other abnormal and inconclusive findings on diagnostic imaging of breast: Secondary | ICD-10-CM

## 2020-06-29 DIAGNOSIS — N6312 Unspecified lump in the right breast, upper inner quadrant: Secondary | ICD-10-CM | POA: Diagnosis not present

## 2020-12-28 ENCOUNTER — Ambulatory Visit
Admission: RE | Admit: 2020-12-28 | Discharge: 2020-12-28 | Disposition: A | Discharge status: Home / Self Care | Payer: BC Managed Care – PPO | Source: Ambulatory Visit | Attending: Family Medicine | Admitting: Family Medicine

## 2020-12-28 ENCOUNTER — Other Ambulatory Visit: Payer: Self-pay

## 2020-12-28 ENCOUNTER — Other Ambulatory Visit: Payer: Self-pay | Admitting: Family Medicine

## 2020-12-28 DIAGNOSIS — R928 Other abnormal and inconclusive findings on diagnostic imaging of breast: Secondary | ICD-10-CM

## 2020-12-28 DIAGNOSIS — N6312 Unspecified lump in the right breast, upper inner quadrant: Secondary | ICD-10-CM | POA: Diagnosis not present

## 2020-12-28 DIAGNOSIS — R922 Inconclusive mammogram: Secondary | ICD-10-CM | POA: Diagnosis not present

## 2021-02-24 DIAGNOSIS — Z23 Encounter for immunization: Secondary | ICD-10-CM | POA: Diagnosis not present

## 2021-02-24 DIAGNOSIS — E782 Mixed hyperlipidemia: Secondary | ICD-10-CM | POA: Diagnosis not present

## 2021-02-24 DIAGNOSIS — Z0000 Encounter for general adult medical examination without abnormal findings: Secondary | ICD-10-CM | POA: Diagnosis not present

## 2021-02-24 DIAGNOSIS — K219 Gastro-esophageal reflux disease without esophagitis: Secondary | ICD-10-CM | POA: Diagnosis not present

## 2021-05-29 DIAGNOSIS — E7800 Pure hypercholesterolemia, unspecified: Secondary | ICD-10-CM | POA: Diagnosis not present

## 2021-05-29 DIAGNOSIS — Z23 Encounter for immunization: Secondary | ICD-10-CM | POA: Diagnosis not present

## 2021-07-03 ENCOUNTER — Other Ambulatory Visit: Payer: BC Managed Care – PPO

## 2021-08-04 ENCOUNTER — Other Ambulatory Visit: Payer: Self-pay

## 2021-08-04 ENCOUNTER — Ambulatory Visit
Admission: RE | Admit: 2021-08-04 | Discharge: 2021-08-04 | Disposition: A | Discharge status: Home / Self Care | Payer: BC Managed Care – PPO | Source: Ambulatory Visit | Attending: Family Medicine | Admitting: Family Medicine

## 2021-08-04 DIAGNOSIS — R928 Other abnormal and inconclusive findings on diagnostic imaging of breast: Secondary | ICD-10-CM

## 2021-08-04 DIAGNOSIS — R922 Inconclusive mammogram: Secondary | ICD-10-CM | POA: Diagnosis not present

## 2021-08-29 ENCOUNTER — Other Ambulatory Visit: Payer: BC Managed Care – PPO

## 2022-03-23 DIAGNOSIS — Z0000 Encounter for general adult medical examination without abnormal findings: Secondary | ICD-10-CM | POA: Diagnosis not present

## 2022-03-23 DIAGNOSIS — E782 Mixed hyperlipidemia: Secondary | ICD-10-CM | POA: Diagnosis not present

## 2022-03-23 DIAGNOSIS — Z23 Encounter for immunization: Secondary | ICD-10-CM | POA: Diagnosis not present

## 2022-03-23 DIAGNOSIS — E7800 Pure hypercholesterolemia, unspecified: Secondary | ICD-10-CM | POA: Diagnosis not present

## 2022-03-23 DIAGNOSIS — Z124 Encounter for screening for malignant neoplasm of cervix: Secondary | ICD-10-CM | POA: Diagnosis not present

## 2022-09-26 ENCOUNTER — Other Ambulatory Visit: Payer: Self-pay | Admitting: Family Medicine

## 2022-09-26 DIAGNOSIS — N6310 Unspecified lump in the right breast, unspecified quadrant: Secondary | ICD-10-CM

## 2022-10-09 ENCOUNTER — Ambulatory Visit
Admission: RE | Admit: 2022-10-09 | Discharge: 2022-10-09 | Disposition: A | Discharge status: Home / Self Care | Payer: BC Managed Care – PPO | Source: Ambulatory Visit | Attending: Family Medicine | Admitting: Family Medicine

## 2022-10-09 DIAGNOSIS — N6310 Unspecified lump in the right breast, unspecified quadrant: Secondary | ICD-10-CM

## 2022-10-09 DIAGNOSIS — N6312 Unspecified lump in the right breast, upper inner quadrant: Secondary | ICD-10-CM | POA: Diagnosis not present

## 2022-10-09 DIAGNOSIS — R928 Other abnormal and inconclusive findings on diagnostic imaging of breast: Secondary | ICD-10-CM | POA: Diagnosis not present

## 2022-11-09 IMAGING — MG DIGITAL DIAGNOSTIC BILAT W/ TOMO W/ CAD
8 series · 8 of 24 positions shown · non-contrast
Comparison: Previous exam(s).

CLINICAL DATA: 60-year-old female for 1 year follow-up of RIGHT
breast mass and for annual bilateral mammogram.

EXAM:
DIGITAL DIAGNOSTIC BILATERAL MAMMOGRAM WITH TOMOSYNTHESIS AND CAD;
ULTRASOUND RIGHT BREAST LIMITED
TECHNIQUE: Bilateral digital diagnostic mammography and breast tomosynthesis
was performed. The images were evaluated with computer-aided
detection.; Targeted ultrasound examination of the right breast was
performed

[L CC synth-2D]
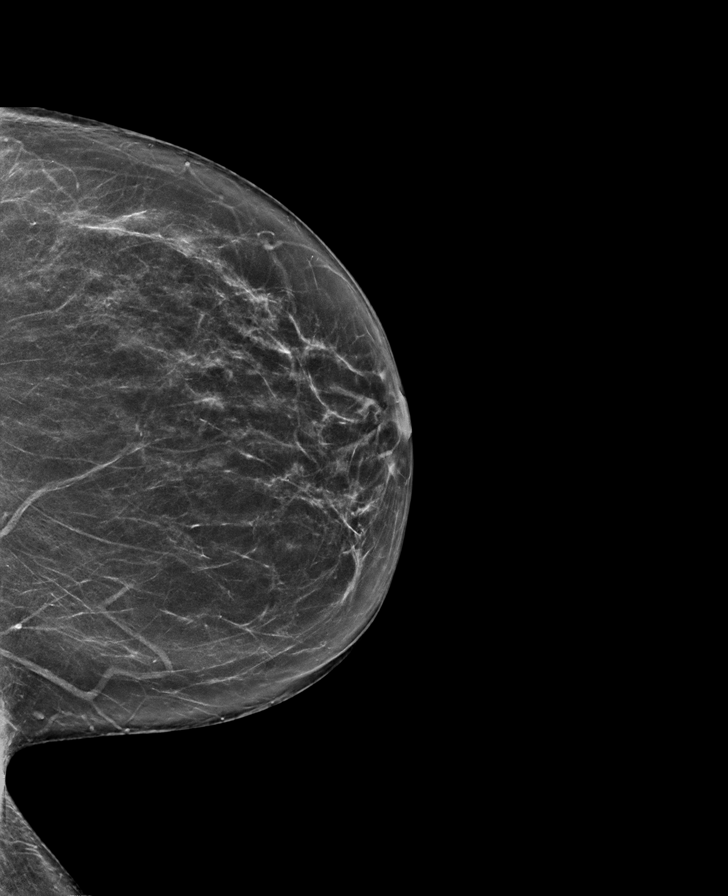

[L MLO synth-2D]
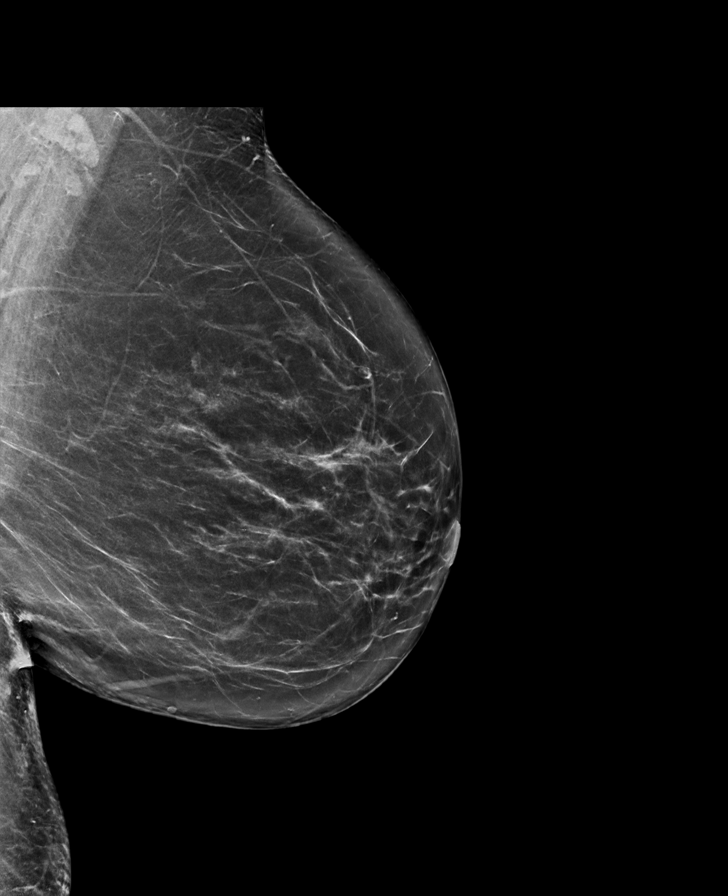

[R CC synth-2D]
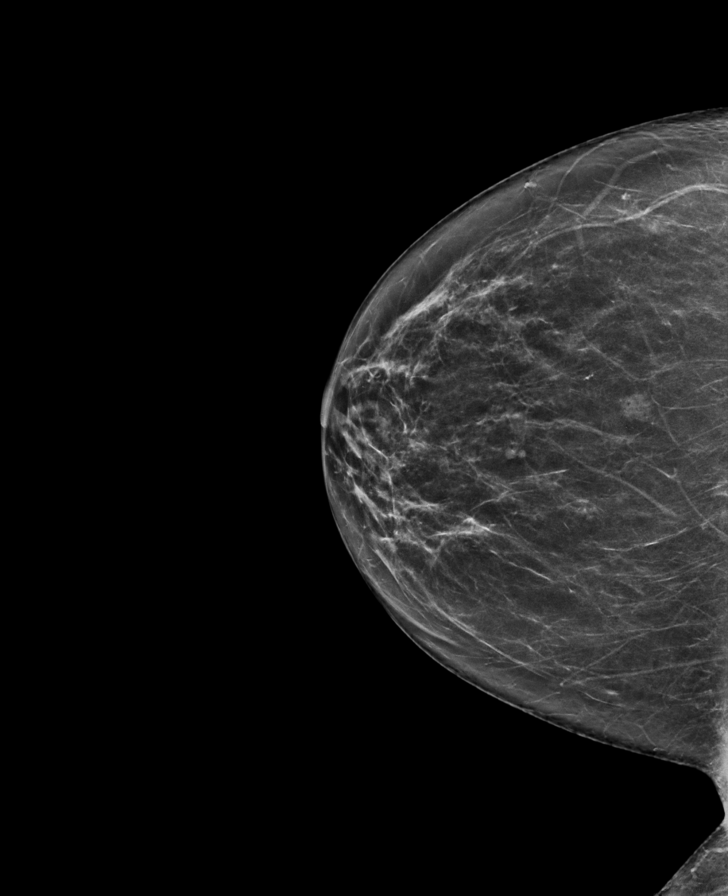

[R MLO synth-2D]
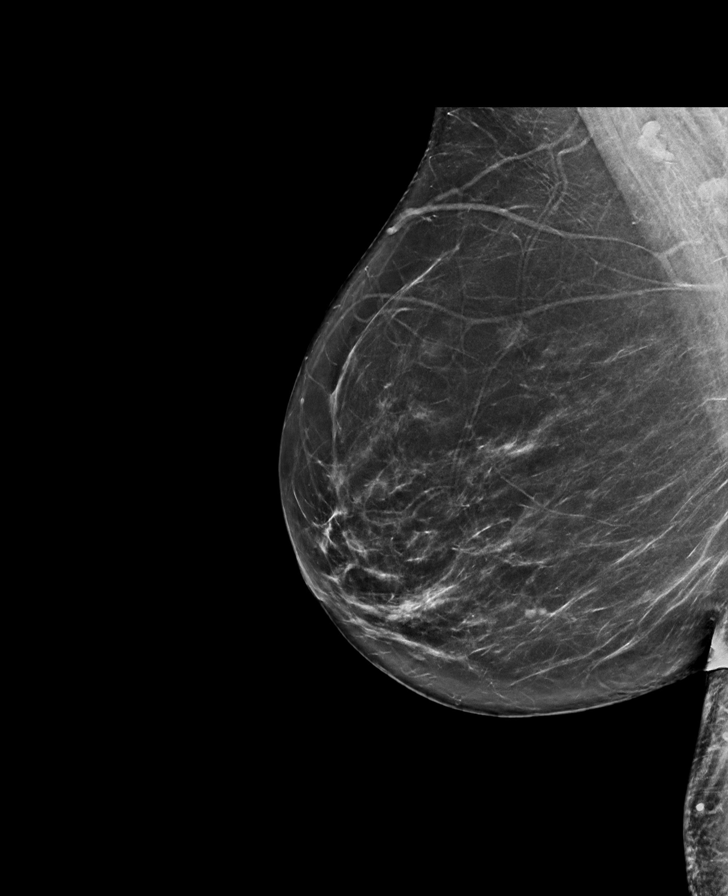

[R CC tomo · tomo slice 35/69.0]
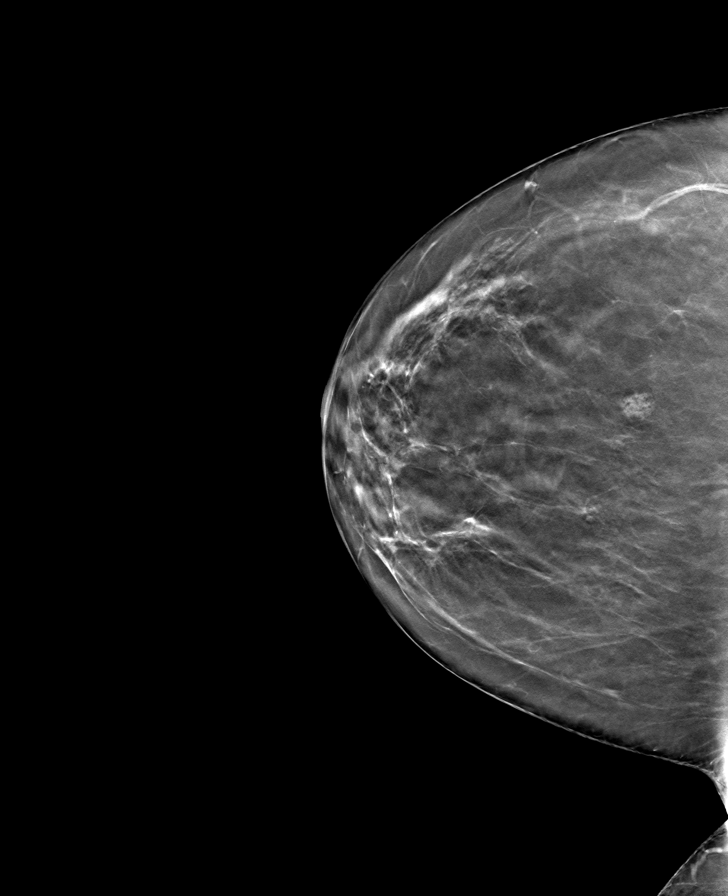

[L MLO tomo · tomo slice 41/81.0]
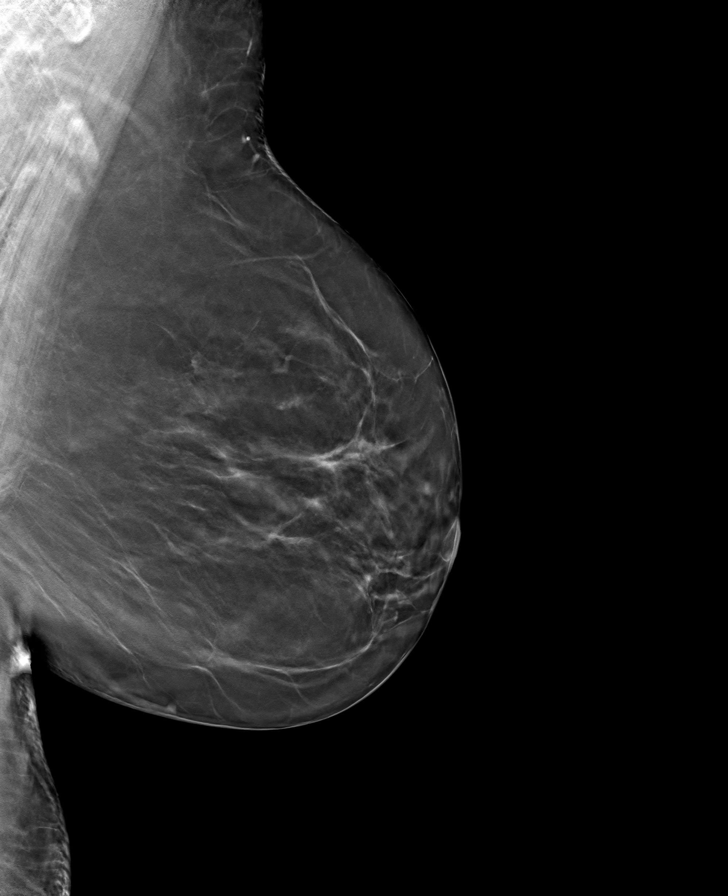

[L CC tomo · tomo slice 36/71.0]
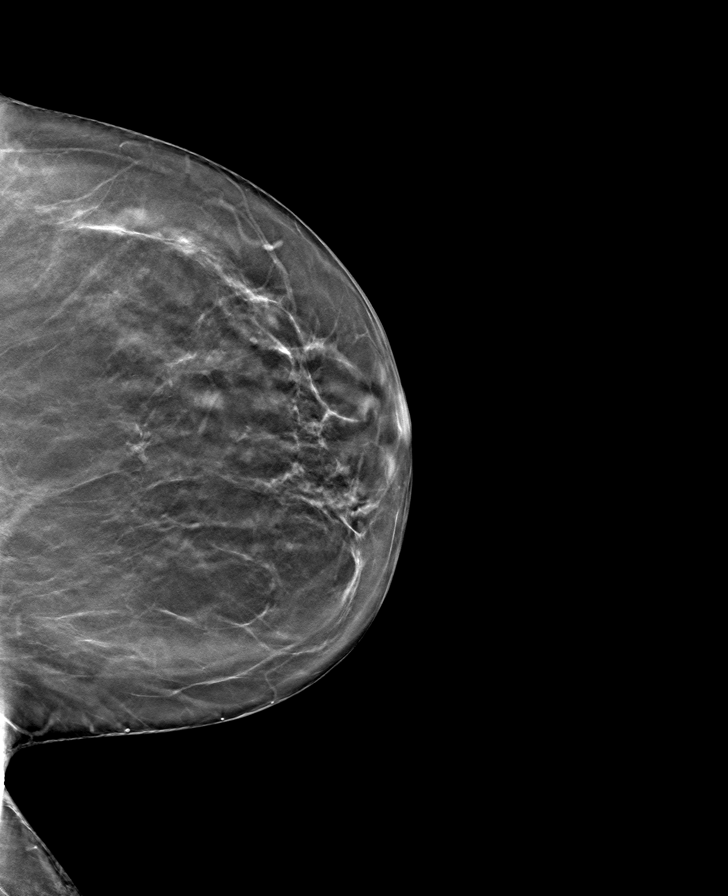

[R MLO tomo · tomo slice 40/79.0]
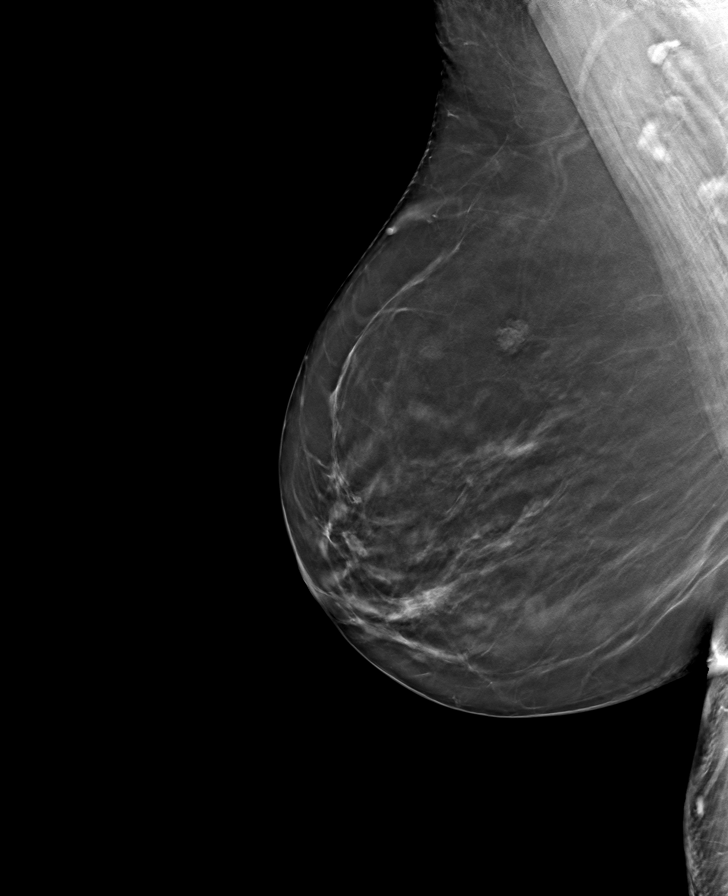

[8 of 24 positions shown; findings below may reference images not displayed]

ACR Breast Density Category b: There are scattered areas of
fibroglandular density.
FINDINGS: 2D/3D full field views of both breasts demonstrate less conspicuous
asymmetry within the UPPER INNER RIGHT breast.

No new or suspicious mammographic findings are noted within either
breast.

Targeted ultrasound is performed, showing a stable 0.5 x 0.1 x
cm circumscribed oval hypoechoic mass at the 1 o'clock position of
the RIGHT breast 6 cm from the nipple.
IMPRESSION: 1. Stable UPPER INNER RIGHT breast mass. One additional follow-up in
1 year is recommended to ensure 2 year stability.
2. No new or suspicious mammographic findings within either breast.

RECOMMENDATION:
Bilateral diagnostic mammogram and RIGHT breast ultrasound in 1
year.

I have discussed the findings and recommendations with the patient.
If applicable, a reminder letter will be sent to the patient
regarding the next appointment.

BI-RADS CATEGORY  3: Probably benign.

## 2023-03-18 DIAGNOSIS — M25561 Pain in right knee: Secondary | ICD-10-CM | POA: Diagnosis not present

## 2023-03-22 ENCOUNTER — Other Ambulatory Visit (INDEPENDENT_AMBULATORY_CARE_PROVIDER_SITE_OTHER): Payer: BC Managed Care – PPO

## 2023-03-22 ENCOUNTER — Ambulatory Visit: Payer: BC Managed Care – PPO | Admitting: Physician Assistant

## 2023-03-22 ENCOUNTER — Encounter: Payer: Self-pay | Admitting: Physician Assistant

## 2023-03-22 DIAGNOSIS — S83241A Other tear of medial meniscus, current injury, right knee, initial encounter: Secondary | ICD-10-CM | POA: Diagnosis not present

## 2023-03-22 DIAGNOSIS — M25561 Pain in right knee: Secondary | ICD-10-CM

## 2023-03-22 DIAGNOSIS — G8929 Other chronic pain: Secondary | ICD-10-CM

## 2023-03-22 NOTE — Progress Notes (Signed)
Office Visit Note   Patient: Lindsay Long           Date of Birth: Jun 21, 1961           MRN: [id number] Visit Date: 03/22/2023              Requested by: Clovis Riley, L.August Saucer, MD [address] [place],  [place] [address] PCP: Clovis Riley, L.August Saucer, MD   Assessment & Plan: Visit Diagnoses:  1. Chronic pain of right knee   2. Acute medial meniscus tear of right knee, initial encounter     Plan: Patient is a pleasant 62 year old woman who is 6 days status post injury to her right medial knee.  She has had some difficulties with her knee in the past but mostly regarding going up and down stairs and having grinding.  She was working on some oral land with her husband on uneven ground.  She began to have aching in the right knee.  Denies any fever or chills.  She noticed that night that when she turns a certain way she would get a sharp pain on the inside of her knee.  This was bad enough that it caused her to wake up at night several times.  Describes her pain as moderate.  Based on exam and x-rays she does have a little arthritis but she also has mechanical symptoms concerning for a medial meniscus tear.  Since its only been 6 days we discussed anti-inflammatory meloxicam which she is already gotten from her primary care provider as well as topical Voltaren gel and a knee support.  Will have her follow-up with Dr. Roda Shutters in 3 weeks.  We did discuss injecting her knee today but she like to refrain from this and see if she improves.  I told her and her follow-up he may offer her an injection just to define where more of her pain is coming from.  However if she has compelling evidence for meniscus tear more than likely he would recommend an MRI  Follow-Up Instructions: Return in about 3 weeks (around 04/12/2023).   Orders:  Orders Placed This Encounter  Procedures   XR KNEE 3 VIEW RIGHT   No orders of the defined types were placed in this encounter.     Procedures: No procedures  performed   Clinical Data: No additional findings.   Subjective: No chief complaint on file.   HPI pleasant 62 year old woman with a 6-day history of right medial knee pain catching and locking.  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  Rates her pain is moderate  Review of Systems  All other systems reviewed and are negative.    Objective: Vital Signs: LMP 06/26/2011   Physical Exam Constitutional:      Appearance: Normal appearance.  Pulmonary:     Effort: Pulmonary effort is normal.  Skin:    General: Skin is warm and dry.  Neurological:     General: No focal deficit present.     Mental Status: She is alert and oriented to person, place, and time.  Psychiatric:        Mood and Affect: Mood normal.     Ortho Exam Right knee is cool no effusion compartments are soft and compressible negative Denna Haggard' sign she is neurovascularly intact.  She does have tenderness to palpation over the medial joint line.  Good endpoint on anterior draw  good extension and flexion no tenderness laterally Specialty Comments:  No specialty comments available.  Imaging: XR KNEE 3 VIEW RIGHT  Result Date: 03/22/2023 Three-view radiographs of her right knee were reviewed today.  Well-maintained alignment she does have some slight sclerotic changes and joint space narrowing of the medial compartment and patellofemoral joint    PMFS History: Patient Active Problem List   Diagnosis Date Noted   Acute medial meniscus tear of right knee 03/22/2023   Right upper quadrant pain 04/17/2017   Heartburn 04/17/2017   Hyperlipidemia 05/31/2016   Anxiety 05/31/2016   Atypical chest pain 05/31/2016   Past Medical History:  Diagnosis Date   Anxiety    Anxiety 05/31/2016   Atypical chest pain 05/31/2016   High cholesterol    Hyperlipidemia 05/31/2016    Family History  Problem Relation Age of  Onset   Diabetes Other    Hypertension Other    Depression Mother    Diabetes Mellitus I Mother    Heart failure Mother    COPD Mother    Liver disease Mother    Diabetes Mellitus I Father    Lupus Sister    CVA Sister    Alcohol abuse Sister    Diabetes Brother    Diabetes Mellitus I Maternal Grandmother    CAD Maternal Grandmother    Diabetes Mellitus I Maternal Grandfather    Diabetes Mellitus I Paternal Grandmother    Lung cancer Paternal Grandfather    Drug abuse Sister    Alcohol abuse Sister    CAD Sister    COPD Sister    Diabetes Sister    Lung cancer Paternal Uncle    Breast cancer Maternal Aunt    Colon cancer Neg Hx    Colon polyps Neg Hx     Past Surgical History:  Procedure Laterality Date   APPENDECTOMY     SHOULDER ARTHROSCOPY Bilateral 2015 & 2016   TUBAL LIGATION     Social History   Occupational History   Not on file  Tobacco Use   Smoking status: Former    Current packs/day: 0.00    Types: Cigarettes    Quit date: 04/12/2008    Years since quitting: 14.9   Smokeless tobacco: Never  Substance and Sexual Activity   Alcohol use: Yes    Comment: occational   Drug use: No   Sexual activity: Never

## 2023-03-26 DIAGNOSIS — K219 Gastro-esophageal reflux disease without esophagitis: Secondary | ICD-10-CM | POA: Diagnosis not present

## 2023-03-26 DIAGNOSIS — Z0000 Encounter for general adult medical examination without abnormal findings: Secondary | ICD-10-CM | POA: Diagnosis not present

## 2023-03-26 DIAGNOSIS — E782 Mixed hyperlipidemia: Secondary | ICD-10-CM | POA: Diagnosis not present

## 2023-04-12 ENCOUNTER — Ambulatory Visit: Payer: BC Managed Care – PPO | Admitting: Orthopaedic Surgery

## 2023-12-24 ENCOUNTER — Other Ambulatory Visit: Payer: Self-pay | Admitting: Family Medicine

## 2023-12-24 DIAGNOSIS — Z1231 Encounter for screening mammogram for malignant neoplasm of breast: Secondary | ICD-10-CM

## 2023-12-25 ENCOUNTER — Ambulatory Visit
Admission: RE | Admit: 2023-12-25 | Discharge: 2023-12-25 | Disposition: A | Discharge status: Home / Self Care | Source: Ambulatory Visit | Attending: Family Medicine | Admitting: Family Medicine

## 2023-12-25 DIAGNOSIS — Z1231 Encounter for screening mammogram for malignant neoplasm of breast: Secondary | ICD-10-CM | POA: Diagnosis not present

## 2024-03-23 DIAGNOSIS — Z1159 Encounter for screening for other viral diseases: Secondary | ICD-10-CM | POA: Diagnosis not present

## 2024-03-23 DIAGNOSIS — E782 Mixed hyperlipidemia: Secondary | ICD-10-CM | POA: Diagnosis not present

## 2024-03-23 DIAGNOSIS — K219 Gastro-esophageal reflux disease without esophagitis: Secondary | ICD-10-CM | POA: Diagnosis not present

## 2024-03-23 DIAGNOSIS — Z131 Encounter for screening for diabetes mellitus: Secondary | ICD-10-CM | POA: Diagnosis not present

## 2024-03-26 ENCOUNTER — Other Ambulatory Visit (HOSPITAL_BASED_OUTPATIENT_CLINIC_OR_DEPARTMENT_OTHER): Payer: Self-pay | Admitting: Family Medicine

## 2024-03-26 DIAGNOSIS — E782 Mixed hyperlipidemia: Secondary | ICD-10-CM | POA: Diagnosis not present

## 2024-03-26 DIAGNOSIS — Z1159 Encounter for screening for other viral diseases: Secondary | ICD-10-CM | POA: Diagnosis not present

## 2024-03-26 DIAGNOSIS — F419 Anxiety disorder, unspecified: Secondary | ICD-10-CM | POA: Diagnosis not present

## 2024-03-26 DIAGNOSIS — Z131 Encounter for screening for diabetes mellitus: Secondary | ICD-10-CM | POA: Diagnosis not present

## 2024-03-26 DIAGNOSIS — Z0189 Encounter for other specified special examinations: Secondary | ICD-10-CM | POA: Diagnosis not present

## 2024-04-13 ENCOUNTER — Ambulatory Visit (HOSPITAL_COMMUNITY)
Admission: RE | Admit: 2024-04-13 | Discharge: 2024-04-13 | Disposition: A | Discharge status: Home / Self Care | Payer: Self-pay | Source: Ambulatory Visit | Attending: Internal Medicine | Admitting: Internal Medicine

## 2024-04-13 DIAGNOSIS — E782 Mixed hyperlipidemia: Secondary | ICD-10-CM | POA: Insufficient documentation
